# Patient Record
Sex: Female | Born: 1955
Health system: Southern US, Community
[De-identification: ages and names within clinical notes are randomized; demographics above are authoritative.]

## PROBLEM LIST (undated history)

## (undated) DIAGNOSIS — F329 Major depressive disorder, single episode, unspecified: Secondary | ICD-10-CM

## (undated) DIAGNOSIS — I872 Venous insufficiency (chronic) (peripheral): Secondary | ICD-10-CM

## (undated) DIAGNOSIS — F3181 Bipolar II disorder: Secondary | ICD-10-CM

## (undated) DIAGNOSIS — D126 Benign neoplasm of colon, unspecified: Secondary | ICD-10-CM

## (undated) DIAGNOSIS — G4733 Obstructive sleep apnea (adult) (pediatric): Secondary | ICD-10-CM

## (undated) DIAGNOSIS — M8580 Other specified disorders of bone density and structure, unspecified site: Secondary | ICD-10-CM

## (undated) DIAGNOSIS — Z808 Family history of malignant neoplasm of other organs or systems: Secondary | ICD-10-CM

## (undated) DIAGNOSIS — F32A Depression, unspecified: Secondary | ICD-10-CM

## (undated) DIAGNOSIS — Z780 Asymptomatic menopausal state: Secondary | ICD-10-CM

## (undated) DIAGNOSIS — E782 Mixed hyperlipidemia: Secondary | ICD-10-CM

## (undated) DIAGNOSIS — I83813 Varicose veins of bilateral lower extremities with pain: Secondary | ICD-10-CM

## (undated) DIAGNOSIS — Z803 Family history of malignant neoplasm of breast: Secondary | ICD-10-CM

## (undated) DIAGNOSIS — I471 Supraventricular tachycardia, unspecified: Secondary | ICD-10-CM

## (undated) DIAGNOSIS — C801 Malignant (primary) neoplasm, unspecified: Secondary | ICD-10-CM

## (undated) HISTORY — DX: Depression, unspecified: F32.A

## (undated) HISTORY — DX: Bipolar II disorder: F31.81

## (undated) HISTORY — PX: MELANOMA EXCISION: SHX5266

## (undated) HISTORY — DX: Family history of malignant neoplasm of other organs or systems: Z80.8

## (undated) HISTORY — DX: Major depressive disorder, single episode, unspecified: F32.9

## (undated) HISTORY — DX: Family history of malignant neoplasm of breast: Z80.3

---
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
## 1978-01-11 HISTORY — PX: DILATION AND CURETTAGE OF UTERUS: SHX78

## 1980-01-12 HISTORY — PX: BREAST EXCISIONAL BIOPSY: SUR124

## 1981-01-11 HISTORY — PX: BREAST CYST ASPIRATION: SHX578

## 2002-01-11 HISTORY — PX: BREAST CYST ASPIRATION: SHX578

## 2002-01-11 HISTORY — PX: BREAST SURGERY: SHX581

## 2004-06-02 ENCOUNTER — Ambulatory Visit: Payer: Self-pay | Admitting: Obstetrics and Gynecology

## 2004-08-24 LAB — HM COLONOSCOPY: HM Colonoscopy: NORMAL

## 2005-02-15 ENCOUNTER — Ambulatory Visit: Payer: Self-pay | Admitting: Psychiatry

## 2005-06-09 ENCOUNTER — Ambulatory Visit: Payer: Self-pay | Admitting: Obstetrics and Gynecology

## 2005-11-22 ENCOUNTER — Ambulatory Visit: Payer: Self-pay | Admitting: Gastroenterology

## 2005-11-22 HISTORY — PX: COLONOSCOPY: SHX174

## 2006-06-28 ENCOUNTER — Ambulatory Visit: Payer: Self-pay | Admitting: Obstetrics and Gynecology

## 2007-07-25 ENCOUNTER — Ambulatory Visit: Payer: Self-pay | Admitting: Obstetrics and Gynecology

## 2007-09-27 ENCOUNTER — Ambulatory Visit: Payer: Self-pay | Admitting: Obstetrics and Gynecology

## 2008-12-03 ENCOUNTER — Ambulatory Visit: Payer: Self-pay | Admitting: Internal Medicine

## 2008-12-16 ENCOUNTER — Other Ambulatory Visit: Payer: Self-pay | Admitting: Internal Medicine

## 2009-06-20 ENCOUNTER — Other Ambulatory Visit: Payer: Self-pay | Admitting: Surgical Oncology

## 2010-02-04 ENCOUNTER — Ambulatory Visit: Payer: Self-pay | Admitting: Internal Medicine

## 2010-03-25 LAB — HM PAP SMEAR: HM Pap smear: NORMAL

## 2011-04-14 ENCOUNTER — Ambulatory Visit: Payer: Self-pay | Admitting: General Practice

## 2012-08-11 LAB — LIPID PANEL: LDL Cholesterol: 125 mg/dL

## 2012-08-11 LAB — BASIC METABOLIC PANEL: Creatinine: 0.9 mg/dL (ref <=1.1)

## 2012-08-24 ENCOUNTER — Encounter: Payer: Self-pay | Admitting: Internal Medicine

## 2012-08-24 ENCOUNTER — Other Ambulatory Visit (HOSPITAL_COMMUNITY)
Admission: RE | Admit: 2012-08-24 | Discharge: 2012-08-24 | Disposition: A | Discharge status: Home / Self Care | Payer: 59 | Source: Ambulatory Visit | Attending: Internal Medicine | Admitting: Internal Medicine

## 2012-08-24 ENCOUNTER — Ambulatory Visit (INDEPENDENT_AMBULATORY_CARE_PROVIDER_SITE_OTHER): Payer: 59 | Admitting: Internal Medicine

## 2012-08-24 VITALS — BP 122/72 | HR 67 | Temp 98.2°F | Resp 12 | Ht 66.0 in | Wt 142.5 lb

## 2012-08-24 DIAGNOSIS — Z1151 Encounter for screening for human papillomavirus (HPV): Secondary | ICD-10-CM | POA: Insufficient documentation

## 2012-08-24 DIAGNOSIS — F319 Bipolar disorder, unspecified: Secondary | ICD-10-CM | POA: Insufficient documentation

## 2012-08-24 DIAGNOSIS — Z1239 Encounter for other screening for malignant neoplasm of breast: Secondary | ICD-10-CM

## 2012-08-24 DIAGNOSIS — Z124 Encounter for screening for malignant neoplasm of cervix: Secondary | ICD-10-CM

## 2012-08-24 DIAGNOSIS — Z01419 Encounter for gynecological examination (general) (routine) without abnormal findings: Secondary | ICD-10-CM | POA: Insufficient documentation

## 2012-08-24 DIAGNOSIS — Z0000 Encounter for general adult medical examination without abnormal findings: Secondary | ICD-10-CM | POA: Insufficient documentation

## 2012-08-24 DIAGNOSIS — F3181 Bipolar II disorder: Secondary | ICD-10-CM | POA: Insufficient documentation

## 2012-08-24 NOTE — Progress Notes (Signed)
Patient ID: Krystal Weaver, female   DOB: 19-Jun-1955, 57 y.o.   MRN: [id number]  Subjective:    Krystal Weaver is a 57 y.o. female who presents for an annual exam. The patient has no complaints today. The patient is sexually active. GYN screening history: last pap: was normal. The patient wears seatbelts: yes. The patient participates in regular exercise: yes. Has the patient ever been transfused or tattooed?: no. The patient reports that there is not domestic violence in her life.   Menstrual History: OB History   Grav Para Term Preterm Abortions TAB SAB Ect Mult Living                  Menarche age: 61  No LMP recorded. Patient is postmenopausal.    The following portions of the patient's history were reviewed and updated as appropriate: allergies, current medications, past family history, past medical history, past social history, past surgical history and problem list.  Review of Systems A comprehensive review of systems was negative except for: Musculoskeletal: positive for arthralgias    Objective:   BP 122/72  Pulse 67  Temp(Src) 98.2 F (36.8 C) (Oral)  Resp 12  Ht 5\' 6"  (1.676 m)  Wt 142 lb 8 oz (64.638 kg)  BMI 23.01 kg/m2  SpO2 98%  General Appearance:    Alert, cooperative, no distress, appears stated age  Head:    Normocephalic, without obvious abnormality, atraumatic  Eyes:    PERRL, conjunctiva/corneas clear, EOM's intact, fundi    benign, both eyes  Ears:    Normal TM's and external ear canals, both ears  Nose:   Nares normal, septum midline, mucosa normal, no drainage    or sinus tenderness  Throat:   Lips, mucosa, and tongue normal; teeth and gums normal  Neck:   Supple, symmetrical, trachea midline, no adenopathy;    thyroid:  no enlargement/tenderness/nodules; no carotid   bruit or JVD  Back:     Symmetric, no curvature, ROM normal, no CVA tenderness  Lungs:     Clear to auscultation bilaterally, respirations unlabored  Chest Wall:    No tenderness or  deformity   Heart:    Regular rate and rhythm, S1 and S2 normal, no murmur, rub   or gallop  Breast Exam:    No tenderness, masses, or nipple abnormality  Abdomen:     Soft, non-tender, bowel sounds active all four quadrants,    no masses, no organomegaly  Genitalia:    Pelvic: cervix normal in appearance, external genitalia normal, no adnexal masses or tenderness, no cervical motion tenderness, rectovaginal septum normal, uterus normal size, shape, and consistency and vagina normal without discharge  Extremities:   Extremities normal, atraumatic, no cyanosis or edema  Pulses:   2+ and symmetric all extremities  Skin:   Skin color, texture, turgor normal, no rashes or lesions  Lymph nodes:   Cervical, supraclavicular, and axillary nodes normal  Neurologic:   CNII-XII intact, normal strength, sensation and reflexes    throughout     Assessment:    Routine general medical examination at a health care facility Annual comprehensive exam was done including breast, pelvic and PAP smear. All screenings have been addressed .   She has a FH of breast CA and has dense breasts on exam,  So 3D mammogram advised.   Bipolar disorder, unspecified Type 2, managed for years by Emilia Beck with lamictal.    Updated Medication List Outpatient Encounter Prescriptions as of 08/24/2012  Medication Sig  Dispense Refill  . Calcium-Magnesium-Vitamin D 600-40-500 MG-MG-UNIT TB24 Take 1 tablet by mouth daily.      . Cholecalciferol (VITAMIN D3) 1000 UNITS CAPS Take 1 capsule by mouth 2 (two) times daily.      . fish oil-omega-3 fatty acids 1000 MG capsule Take 1,200 g by mouth daily.      Marland Kitchen lamoTRIgine (LAMICTAL) 100 MG tablet Take 50 mg by mouth daily.      . Multiple Vitamins-Minerals (MULTIVITAMIN WITH MINERALS) tablet Take 1 tablet by mouth daily.      . vitamin C (ASCORBIC ACID) 500 MG tablet Take 500 mg by mouth daily.       No facility-administered encounter medications on file as of 08/24/2012.

## 2012-08-24 NOTE — Assessment & Plan Note (Addendum)
Annual comprehensive exam was done including breast, pelvic and PAP smear. All screenings have been addressed .   She has a FH of breast CA and has dense breasts on exam,  So 3D mammogram advised.

## 2012-08-24 NOTE — Assessment & Plan Note (Signed)
Type 2, managed for years by Emilia Beck with lamictal.

## 2012-08-29 ENCOUNTER — Encounter: Payer: Self-pay | Admitting: *Deleted

## 2012-09-04 ENCOUNTER — Encounter: Payer: Self-pay | Admitting: General Practice

## 2012-10-17 ENCOUNTER — Telehealth: Payer: Self-pay | Admitting: Internal Medicine

## 2012-10-17 NOTE — Telephone Encounter (Signed)
Yes she can wait

## 2012-10-17 NOTE — Telephone Encounter (Signed)
Please advise 

## 2012-10-17 NOTE — Telephone Encounter (Signed)
Called patient to see if she had scheduled mammogram. She wants to wait until the 3D mammogram comes to [place]. She is wondering if it is ok to wait to the end of the year for this, or should she proceed with the regular one. Please advise.

## 2012-10-19 NOTE — Telephone Encounter (Signed)
Left message to call office

## 2012-10-20 NOTE — Telephone Encounter (Signed)
Left message, notifying pt she may wait for 3D mammogram

## 2013-04-09 ENCOUNTER — Ambulatory Visit: Payer: Self-pay | Admitting: Internal Medicine

## 2013-05-24 ENCOUNTER — Encounter: Payer: Self-pay | Admitting: Internal Medicine

## 2014-04-17 ENCOUNTER — Encounter: Payer: Self-pay | Admitting: *Deleted

## 2014-05-10 ENCOUNTER — Telehealth: Payer: Self-pay

## 2014-05-10 DIAGNOSIS — Z1239 Encounter for other screening for malignant neoplasm of breast: Secondary | ICD-10-CM

## 2014-05-10 NOTE — Telephone Encounter (Signed)
Mammogram ordered

## 2014-05-10 NOTE — Telephone Encounter (Signed)
The patient called and is hoping for an order for her mammogram to be ordered.

## 2014-05-10 NOTE — Telephone Encounter (Signed)
Please advise. In 2014 a 3 D mammogram was ordered.Marland Kitchen

## 2014-06-17 ENCOUNTER — Ambulatory Visit
Admission: RE | Admit: 2014-06-17 | Discharge: 2014-06-17 | Disposition: A | Discharge status: Home / Self Care | Payer: 59 | Source: Ambulatory Visit | Attending: Internal Medicine | Admitting: Internal Medicine

## 2014-06-17 ENCOUNTER — Other Ambulatory Visit: Payer: Self-pay | Admitting: Internal Medicine

## 2014-06-17 DIAGNOSIS — Z1239 Encounter for other screening for malignant neoplasm of breast: Secondary | ICD-10-CM

## 2014-06-17 DIAGNOSIS — Z1231 Encounter for screening mammogram for malignant neoplasm of breast: Secondary | ICD-10-CM | POA: Insufficient documentation

## 2014-06-19 ENCOUNTER — Encounter: Payer: Self-pay | Admitting: Internal Medicine

## 2014-08-02 NOTE — Telephone Encounter (Signed)
Mailed unread my chart message to patient

## 2014-10-24 ENCOUNTER — Other Ambulatory Visit
Admission: RE | Admit: 2014-10-24 | Discharge: 2014-10-24 | Disposition: A | Discharge status: Home / Self Care | Payer: 59 | Source: Ambulatory Visit | Attending: Psychiatry | Admitting: Psychiatry

## 2014-10-24 DIAGNOSIS — F3181 Bipolar II disorder: Secondary | ICD-10-CM | POA: Insufficient documentation

## 2014-10-24 LAB — TSH: TSH: 0.909 u[IU]/mL (ref 0.350–4.500)

## 2014-10-24 LAB — VITAMIN B12: VITAMIN B 12: 447 pg/mL (ref 180–914)

## 2014-10-24 LAB — FOLATE: FOLATE: 17.1 ng/mL (ref >5.9)

## 2014-10-25 LAB — VITAMIN D 25 HYDROXY (VIT D DEFICIENCY, FRACTURES): VIT D 25 HYDROXY: 34 ng/mL (ref 30.0–100.0)

## 2015-01-23 ENCOUNTER — Other Ambulatory Visit
Admission: RE | Admit: 2015-01-23 | Discharge: 2015-01-23 | Disposition: A | Discharge status: Home / Self Care | Payer: 59 | Source: Ambulatory Visit | Attending: Psychiatry | Admitting: Psychiatry

## 2015-01-23 DIAGNOSIS — F3181 Bipolar II disorder: Secondary | ICD-10-CM | POA: Insufficient documentation

## 2015-01-24 LAB — LAMOTRIGINE LEVEL: LAMOTRIGINE LVL: 1.4 ug/mL — AB (ref 2.0–20.0)

## 2015-02-12 DIAGNOSIS — D229 Melanocytic nevi, unspecified: Secondary | ICD-10-CM | POA: Diagnosis not present

## 2015-02-12 DIAGNOSIS — I8393 Asymptomatic varicose veins of bilateral lower extremities: Secondary | ICD-10-CM | POA: Diagnosis not present

## 2015-02-12 DIAGNOSIS — L578 Other skin changes due to chronic exposure to nonionizing radiation: Secondary | ICD-10-CM | POA: Diagnosis not present

## 2015-02-12 DIAGNOSIS — D1800 Hemangioma unspecified site: Secondary | ICD-10-CM | POA: Diagnosis not present

## 2015-02-12 DIAGNOSIS — L821 Other seborrheic keratosis: Secondary | ICD-10-CM | POA: Diagnosis not present

## 2015-02-12 DIAGNOSIS — Z1283 Encounter for screening for malignant neoplasm of skin: Secondary | ICD-10-CM | POA: Diagnosis not present

## 2015-02-19 ENCOUNTER — Encounter: Payer: Self-pay | Admitting: Internal Medicine

## 2015-02-19 ENCOUNTER — Ambulatory Visit (INDEPENDENT_AMBULATORY_CARE_PROVIDER_SITE_OTHER): Payer: 59 | Admitting: Internal Medicine

## 2015-02-19 VITALS — BP 104/70 | HR 74 | Temp 98.4°F | Resp 12 | Ht 65.0 in | Wt 147.5 lb

## 2015-02-19 DIAGNOSIS — E785 Hyperlipidemia, unspecified: Secondary | ICD-10-CM | POA: Diagnosis not present

## 2015-02-19 DIAGNOSIS — Z79899 Other long term (current) drug therapy: Secondary | ICD-10-CM | POA: Diagnosis not present

## 2015-02-19 DIAGNOSIS — Z1239 Encounter for other screening for malignant neoplasm of breast: Secondary | ICD-10-CM

## 2015-02-19 DIAGNOSIS — F319 Bipolar disorder, unspecified: Secondary | ICD-10-CM

## 2015-02-19 DIAGNOSIS — Z1211 Encounter for screening for malignant neoplasm of colon: Secondary | ICD-10-CM | POA: Diagnosis not present

## 2015-02-19 DIAGNOSIS — Z0000 Encounter for general adult medical examination without abnormal findings: Secondary | ICD-10-CM

## 2015-02-19 LAB — LIPID PANEL
CHOL/HDL RATIO: 3
Cholesterol: 250 mg/dL — ABNORMAL HIGH (ref 0–200)
HDL: 77.5 mg/dL (ref >39.00)
LDL CALC: 144 mg/dL — AB (ref 0–99)
NONHDL: 172.54
TRIGLYCERIDES: 142 mg/dL (ref 0.0–149.0)
VLDL: 28.4 mg/dL (ref 0.0–40.0)

## 2015-02-19 LAB — COMPREHENSIVE METABOLIC PANEL
ALT: 16 U/L (ref 0–35)
AST: 25 U/L (ref 0–37)
Albumin: 4.6 g/dL (ref 3.5–5.2)
Alkaline Phosphatase: 60 U/L (ref 39–117)
BILIRUBIN TOTAL: 0.4 mg/dL (ref 0.2–1.2)
BUN: 12 mg/dL (ref 6–23)
CALCIUM: 9.8 mg/dL (ref 8.4–10.5)
CHLORIDE: 102 meq/L (ref 96–112)
CO2: 33 meq/L — AB (ref 19–32)
Creatinine, Ser: 0.94 mg/dL (ref 0.40–1.20)
GFR: 64.51 mL/min (ref >60.00)
GLUCOSE: 90 mg/dL (ref 70–99)
Potassium: 4 mEq/L (ref 3.5–5.1)
Sodium: 141 mEq/L (ref 135–145)
Total Protein: 6.6 g/dL (ref 6.0–8.3)

## 2015-02-19 NOTE — Progress Notes (Signed)
Pre-visit discussion using our clinic review tool. No additional management support is needed unless otherwise documented below in the visit note.  

## 2015-02-19 NOTE — Progress Notes (Signed)
Patient ID: Krystal Weaver, female    DOB: 09-15-1955  Age: 61 y.o. MRN: [id number]  The patient is here for annual wellness examination and management of other chronic and acute problems.   Sister has BRCA at age 9,  Mammogram due June 2017 Due for PAP smear in August  Due for colonoscopy.  Krystal Weaver . Takes 2000 IUS Vit d3 daily Skin exam by Nehemiah Massed last week   neds cmet AND FASTING  lipids.   The risk factors are reflected in the social history.  The roster of all physicians providing medical care to patient - is listed in the Snapshot section of the chart.  Activities of daily living:  The patient is 100% independent in all ADLs: dressing, toileting, feeding as well as independent mobility  Home safety : The patient has smoke detectors in the home. They wear seatbelts.  There are no firearms at home. There is no violence in the home.   There is no risks for hepatitis, STDs or HIV. There is no   history of blood transfusion. They have no travel history to infectious disease endemic areas of the world.  The patient has seen their dentist in the last six month. They have seen their eye doctor in the last year. They admit to slight hearing difficulty with regard to whispered voices and some television programs.  They have deferred audiologic testing in the last year.  They do not  have excessive sun exposure. Discussed the need for sun protection: hats, long sleeves and use of sunscreen if there is significant sun exposure.   Diet: the importance of a healthy diet is discussed. They do have a healthy diet.  The benefits of regular aerobic exercise were discussed. She walks 4 times per week ,  20 minutes.   Depression screen: there are no signs or vegative symptoms of depression- irritability, change in appetite, anhedonia, sadness/tearfullness.  Cognitive assessment: the patient manages all their financial and personal affairs and is actively engaged. They could relate  day,date,year and events; recalled 2/3 objects at 3 minutes; performed clock-face test normally.  The following portions of the patient's history were reviewed and updated as appropriate: allergies, current medications, past family history, past medical history,  past surgical history, past social history  and problem list.  Visual acuity was not assessed per patient preference since she has regular follow up with her ophthalmologist. Hearing and body mass index were assessed and reviewed.   During the course of the visit the patient was educated and counseled about appropriate screening and preventive services including : fall prevention , diabetes screening, nutrition counseling, colorectal cancer screening, and recommended immunizations.    CC: The primary encounter diagnosis was Colon cancer screening. Diagnoses of Hyperlipidemia, Long-term use of high-risk medication, Breast cancer screening, Encounter for preventive health examination, and Bipolar disorder, unspecified ([place]) were also pertinent to this visit.  Dr Octavia Heir increased lamictal and started her on elavil in september for hypomania/ bipolar management with sleep disruption.  Constipation a minor issue since starting Elavil .  Eating steel cut oats has helped but  Using Sennakot and stool softener daily.  History Krystal Weaver has a past medical history of Depression.   She has past surgical history that includes Breast surgery (Left, 2004); Breast biopsy (Left, 1982); and Breast cyst aspiration (1983).   Her family history includes Alcohol abuse in her mother, sister, and son; Breast cancer (age of onset: 31) in her sister; Breast cancer (age of onset: 70) in  her maternal aunt; Cancer in her maternal aunt and sister; Cancer (age of onset: 59) in her father; Heart disease in her mother; Learning disabilities in her mother; Mental illness in her mother.She reports that she has never smoked. She has never used smokeless tobacco. She reports  that she drinks about 3.0 oz of alcohol per week. She reports that she does not use illicit drugs.  Outpatient Prescriptions Prior to Visit  Medication Sig Dispense Refill  . Cholecalciferol (VITAMIN D3) 1000 UNITS CAPS Take 1 capsule by mouth 2 (two) times daily.    . fish oil-omega-3 fatty acids 1000 MG capsule Take 1,200 g by mouth daily.    Marland Kitchen lamoTRIgine (LAMICTAL) 100 MG tablet Take 50 mg by mouth daily.    . Multiple Vitamins-Minerals (MULTIVITAMIN WITH MINERALS) tablet Take 1 tablet by mouth daily.    . vitamin C (ASCORBIC ACID) 500 MG tablet Take 500 mg by mouth daily.    . Calcium-Magnesium-Vitamin D 701-77-939 MG-MG-UNIT TB24 Take 1 tablet by mouth daily. Reported on 02/19/2015     No facility-administered medications prior to visit.    Review of Systems  Patient denies headache, fevers, malaise, unintentional weight loss, skin rash, eye pain, sinus congestion and sinus pain, sore throat, dysphagia,  hemoptysis , cough, dyspnea, wheezing, chest pain, palpitations, orthopnea, edema, abdominal pain, nausea, melena, diarrhea, constipation, flank pain, dysuria, hematuria, urinary  Frequency, nocturia, numbness, tingling, seizures,  Focal weakness, Loss of consciousness,  Tremor, insomnia, depression, anxiety, and suicidal ideation.    Objective:  BP 104/70 mmHg  Pulse 74  Temp(Src) 98.4 F (36.9 C) (Oral)  Resp 12  Ht _0  (1.651 m)  Wt 147 lb 8 oz (66.906 kg)  BMI 24.55 kg/m2  SpO2 99%  Physical Exam   General appearance: alert, cooperative and appears stated age Head: Normocephalic, without obvious abnormality, atraumatic Eyes: conjunctivae/corneas clear. PERRL, EOM's intact. Fundi benign. Ears: normal TM's and external ear canals both ears Nose: Nares normal. Septum midline. Mucosa normal. No drainage or sinus tenderness. Throat: lips, mucosa, and tongue normal; teeth and gums normal Neck: no adenopathy, no carotid bruit, no JVD, supple, symmetrical, trachea midline and  thyroid not enlarged, symmetric, no tenderness/mass/nodules Lungs: clear to auscultation bilaterally Breasts: normal appearance, no masses or tenderness Heart: regular rate and rhythm, S1, S2 normal, no murmur, click, rub or gallop Abdomen: soft, non-tender; bowel sounds normal; no masses,  no organomegaly Extremities: extremities normal, atraumatic, no cyanosis or edema Pulses: 2+ and symmetric Skin: Skin color, texture, turgor normal. No rashes or lesions Neurologic: Alert and oriented X 3, normal strength and tone. Normal symmetric reflexes. Normal coordination and gait.     Assessment & Plan:   Problem List Items Addressed This Visit    Bipolar disorder, unspecified ([place])    Managed with lamictal and elavil by Royston Bake.       Encounter for preventive health examination    Annual comprehensive preventive exam was done as well as an evaluation and assessment  of chronic conditions .  During the course of the visit the patient was educated and counseled about appropriate screening and preventive services including :  diabetes screening, lipid analysis with projected  10 year  risk for CAD , nutrition counseling, breast, cervical and colorectal cancer screening, and recommended immunizations.  Printed recommendations for health maintenance screenings was given      Hyperlipidemia    Based on current lipid profile, the risk of clinically significant Coronary artery disease is 3.9% over the  next 10 years, using the Framingham risk calculator.  No medication needed   Lab Results  Component Value Date   CHOL 250* 02/19/2015   HDL 77.50 02/19/2015   LDLCALC 144* 02/19/2015   TRIG 142.0 02/19/2015   CHOLHDL 3 02/19/2015         Relevant Orders   Lipid panel (Completed)    Other Visit Diagnoses    Colon cancer screening    -  Primary    Relevant Orders    Ambulatory referral to Gastroenterology    Long-term use of high-risk medication        Relevant Orders    Comprehensive  metabolic panel (Completed)    Breast cancer screening        Relevant Orders    MM DIGITAL SCREENING BILATERAL       I am having Krystal Weaver maintain her lamoTRIgine, fish oil-omega-3 fatty acids, Calcium-Magnesium-Vitamin D, vitamin C, Vitamin D3, multivitamin with minerals, amitriptyline, and Misc Natural Products (GLUCOS-CHONDROIT-MSM COMPLEX PO).  Meds ordered this encounter  Medications  . amitriptyline (ELAVIL) 50 MG tablet    Sig: Take 50 mg by mouth at bedtime.  . Misc Natural Products (GLUCOS-CHONDROIT-MSM COMPLEX PO)    Sig: Take 1 capsule by mouth 2 (two) times daily.    There are no discontinued medications.  Follow-up: No Follow-up on file.   Crecencio Mc, MD

## 2015-02-19 NOTE — Patient Instructions (Signed)
We will do your PAP smear next year!  Menopause is a normal process in which your reproductive ability comes to an end. This process happens gradually over a span of months to years, usually between the ages of 38 and 89. Menopause is complete when you have missed 12 consecutive menstrual periods. It is important to talk with your health care provider about some of the most common conditions that affect postmenopausal women, such as heart disease, cancer, and bone loss (osteoporosis). Adopting a healthy lifestyle and getting preventive care can help to promote your health and wellness. Those actions can also lower your chances of developing some of these common conditions. WHAT SHOULD I KNOW ABOUT MENOPAUSE? During menopause, you may experience a number of symptoms, such as:  Moderate-to-severe hot flashes.  Night sweats.  Decrease in sex drive.  Mood swings.  Headaches.  Tiredness.  Irritability.  Memory problems.  Insomnia. Choosing to treat or not to treat menopausal changes is an individual decision that you make with your health care provider. WHAT SHOULD I KNOW ABOUT HORMONE REPLACEMENT THERAPY AND SUPPLEMENTS? Hormone therapy products are effective for treating symptoms that are associated with menopause, such as hot flashes and night sweats. Hormone replacement carries certain risks, especially as you become older. If you are thinking about using estrogen or estrogen with progestin treatments, discuss the benefits and risks with your health care provider. WHAT SHOULD I KNOW ABOUT HEART DISEASE AND STROKE? Heart disease, heart attack, and stroke become more likely as you age. This may be due, in part, to the hormonal changes that your body experiences during menopause. These can affect how your body processes dietary fats, triglycerides, and cholesterol. Heart attack and stroke are both medical emergencies. There are many things that you can do to help prevent heart disease and  stroke:  Have your blood pressure checked at least every 1-2 years. High blood pressure causes heart disease and increases the risk of stroke.  If you are 32-100 years old, ask your health care provider if you should take aspirin to prevent a heart attack or a stroke.  Do not use any tobacco products, including cigarettes, chewing tobacco, or electronic cigarettes. If you need help quitting, ask your health care provider.  It is important to eat a healthy diet and maintain a healthy weight.  Be sure to include plenty of vegetables, fruits, low-fat dairy products, and lean protein.  Avoid eating foods that are high in solid fats, added sugars, or salt (sodium).  Get regular exercise. This is one of the most important things that you can do for your health.  Try to exercise for at least 150 minutes each week. The type of exercise that you do should increase your heart rate and make you sweat. This is known as moderate-intensity exercise.  Try to do strengthening exercises at least twice each week. Do these in addition to the moderate-intensity exercise.  Know your numbers.Ask your health care provider to check your cholesterol and your blood glucose. Continue to have your blood tested as directed by your health care provider. WHAT SHOULD I KNOW ABOUT CANCER SCREENING? There are several types of cancer. Take the following steps to reduce your risk and to catch any cancer development as early as possible. Breast Cancer  Practice breast self-awareness.  This means understanding how your breasts normally appear and feel.  It also means doing regular breast self-exams. Let your health care provider know about any changes, no matter how small.  If you  are 18 or older, have a clinician do a breast exam (clinical breast exam or CBE) every year. Depending on your age, family history, and medical history, it may be recommended that you also have a yearly breast X-ray (mammogram).  If you have a  family history of breast cancer, talk with your health care provider about genetic screening.  If you are at high risk for breast cancer, talk with your health care provider about having an MRI and a mammogram every year.  Breast cancer (BRCA) gene test is recommended for women who have family members with BRCA-related cancers. Results of the assessment will determine the need for genetic counseling and BRCA1 and for BRCA2 testing. BRCA-related cancers include these types:  Breast. This occurs in males or females.  Ovarian.  Tubal. This may also be called fallopian tube cancer.  Cancer of the abdominal or pelvic lining (peritoneal cancer).  Prostate.  Pancreatic. Cervical, Uterine, and Ovarian Cancer Your health care provider may recommend that you be screened regularly for cancer of the pelvic organs. These include your ovaries, uterus, and vagina. This screening involves a pelvic exam, which includes checking for microscopic changes to the surface of your cervix (Pap test).  For women ages 21-65, health care providers may recommend a pelvic exam and a Pap test every three years. For women ages 37-65, they may recommend the Pap test and pelvic exam, combined with testing for human papilloma virus (HPV), every five years. Some types of HPV increase your risk of cervical cancer. Testing for HPV may also be done on women of any age who have unclear Pap test results.  Other health care providers may not recommend any screening for nonpregnant women who are considered low risk for pelvic cancer and have no symptoms. Ask your health care provider if a screening pelvic exam is right for you.  If you have had past treatment for cervical cancer or a condition that could lead to cancer, you need Pap tests and screening for cancer for at least 20 years after your treatment. If Pap tests have been discontinued for you, your risk factors (such as having a new sexual partner) need to be reassessed to  determine if you should start having screenings again. Some women have medical problems that increase the chance of getting cervical cancer. In these cases, your health care provider may recommend that you have screening and Pap tests more often.  If you have a family history of uterine cancer or ovarian cancer, talk with your health care provider about genetic screening.  If you have vaginal bleeding after reaching menopause, tell your health care provider.  There are currently no reliable tests available to screen for ovarian cancer. Lung Cancer Lung cancer screening is recommended for adults 11-51 years old who are at high risk for lung cancer because of a history of smoking. A yearly low-dose CT scan of the lungs is recommended if you:  Currently smoke.  Have a history of at least 30 pack-years of smoking and you currently smoke or have quit within the past 15 years. A pack-year is smoking an average of one pack of cigarettes per day for one year. Yearly screening should:  Continue until it has been 15 years since you quit.  Stop if you develop a health problem that would prevent you from having lung cancer treatment. Colorectal Cancer  This type of cancer can be detected and can often be prevented.  Routine colorectal cancer screening usually begins at age 62  and continues through age 90.  If you have risk factors for colon cancer, your health care provider may recommend that you be screened at an earlier age.  If you have a family history of colorectal cancer, talk with your health care provider about genetic screening.  Your health care provider may also recommend using home test kits to check for hidden blood in your stool.  A small camera at the end of a tube can be used to examine your colon directly (sigmoidoscopy or colonoscopy). This is done to check for the earliest forms of colorectal cancer.  Direct examination of the colon should be repeated every 5-10 years until age  47. However, if early forms of precancerous polyps or small growths are found or if you have a family history or genetic risk for colorectal cancer, you may need to be screened more often. Skin Cancer  Check your skin from head to toe regularly.  Monitor any moles. Be sure to tell your health care provider:  About any new moles or changes in moles, especially if there is a change in a mole's shape or color.  If you have a mole that is larger than the size of a pencil eraser.  If any of your family members has a history of skin cancer, especially at a young age, talk with your health care provider about genetic screening.  Always use sunscreen. Apply sunscreen liberally and repeatedly throughout the day.  Whenever you are outside, protect yourself by wearing long sleeves, pants, a wide-brimmed hat, and sunglasses. WHAT SHOULD I KNOW ABOUT OSTEOPOROSIS? Osteoporosis is a condition in which bone destruction happens more quickly than new bone creation. After menopause, you may be at an increased risk for osteoporosis. To help prevent osteoporosis or the bone fractures that can happen because of osteoporosis, the following is recommended:  If you are 56-58 years old, get at least 1,000 mg of calcium and at least 600 mg of vitamin D per day.  If you are older than age 51 but younger than age 16, get at least 1,200 mg of calcium and at least 600 mg of vitamin D per day.  If you are older than age 30, get at least 1,200 mg of calcium and at least 800 mg of vitamin D per day. Smoking and excessive alcohol intake increase the risk of osteoporosis. Eat foods that are rich in calcium and vitamin D, and do weight-bearing exercises several times each week as directed by your health care provider. WHAT SHOULD I KNOW ABOUT HOW MENOPAUSE AFFECTS [place]? Depression may occur at any age, but it is more common as you become older. Common symptoms of depression include:  Low or sad mood.  Changes  in sleep patterns.  Changes in appetite or eating patterns.  Feeling an overall lack of motivation or enjoyment of activities that you previously enjoyed.  Frequent crying spells. Talk with your health care provider if you think that you are experiencing depression. WHAT SHOULD I KNOW ABOUT IMMUNIZATIONS? It is important that you get and maintain your immunizations. These include:  Tetanus, diphtheria, and pertussis (Tdap) booster vaccine.  Influenza every year before the flu season begins.  Pneumonia vaccine.  Shingles vaccine. Your health care provider may also recommend other immunizations.   This information is not intended to replace advice given to you by your health care provider. Make sure you discuss any questions you have with your health care provider.   Document Released: 02/19/2005 Document Revised: 01/18/2014  Document Reviewed: 08/30/2013 Elsevier Interactive Patient Education [employer].

## 2015-02-20 DIAGNOSIS — E785 Hyperlipidemia, unspecified: Secondary | ICD-10-CM | POA: Insufficient documentation

## 2015-02-20 NOTE — Assessment & Plan Note (Addendum)
Based on current lipid profile, the risk of clinically significant Coronary artery disease is 3.9% over the next 10 years, using the Framingham risk calculator.  No medication needed   Lab Results  Component Value Date   CHOL 250* 02/19/2015   HDL 77.50 02/19/2015   LDLCALC 144* 02/19/2015   TRIG 142.0 02/19/2015   CHOLHDL 3 02/19/2015

## 2015-02-20 NOTE — Assessment & Plan Note (Signed)
Managed with lamictal and elavil by Royston Bake.

## 2015-02-20 NOTE — Assessment & Plan Note (Signed)
Annual comprehensive preventive exam was done as well as an evaluation and assessment  of chronic conditions .  During the course of the visit the patient was educated and counseled about appropriate screening and preventive services including :  diabetes screening, lipid analysis with projected  10 year  risk for CAD , nutrition counseling, breast, cervical and colorectal cancer screening, and recommended immunizations.  Printed recommendations for health maintenance screenings was given

## 2015-02-22 ENCOUNTER — Encounter: Payer: Self-pay | Admitting: Internal Medicine

## 2015-03-24 DIAGNOSIS — F3181 Bipolar II disorder: Secondary | ICD-10-CM | POA: Diagnosis not present

## 2015-06-30 DIAGNOSIS — F3181 Bipolar II disorder: Secondary | ICD-10-CM | POA: Diagnosis not present

## 2015-07-02 DIAGNOSIS — H52222 Regular astigmatism, left eye: Secondary | ICD-10-CM | POA: Diagnosis not present

## 2015-07-02 DIAGNOSIS — H524 Presbyopia: Secondary | ICD-10-CM | POA: Diagnosis not present

## 2015-07-02 DIAGNOSIS — H5203 Hypermetropia, bilateral: Secondary | ICD-10-CM | POA: Diagnosis not present

## 2015-07-30 ENCOUNTER — Ambulatory Visit
Admission: RE | Admit: 2015-07-30 | Discharge: 2015-07-30 | Disposition: A | Discharge status: Home / Self Care | Payer: 59 | Source: Ambulatory Visit | Attending: Internal Medicine | Admitting: Internal Medicine

## 2015-07-30 ENCOUNTER — Other Ambulatory Visit: Payer: Self-pay | Admitting: Internal Medicine

## 2015-07-30 DIAGNOSIS — Z1239 Encounter for other screening for malignant neoplasm of breast: Secondary | ICD-10-CM

## 2015-07-30 DIAGNOSIS — Z1231 Encounter for screening mammogram for malignant neoplasm of breast: Secondary | ICD-10-CM | POA: Insufficient documentation

## 2015-10-06 DIAGNOSIS — F3181 Bipolar II disorder: Secondary | ICD-10-CM | POA: Diagnosis not present

## 2015-11-01 DIAGNOSIS — H578 Other specified disorders of eye and adnexa: Secondary | ICD-10-CM | POA: Diagnosis not present

## 2015-11-03 ENCOUNTER — Ambulatory Visit (INDEPENDENT_AMBULATORY_CARE_PROVIDER_SITE_OTHER): Payer: 59 | Admitting: Vascular Surgery

## 2015-11-03 ENCOUNTER — Encounter (INDEPENDENT_AMBULATORY_CARE_PROVIDER_SITE_OTHER): Payer: Self-pay | Admitting: Vascular Surgery

## 2015-11-03 VITALS — BP 132/61 | HR 65 | Resp 17 | Ht 66.0 in | Wt 152.0 lb

## 2015-11-03 DIAGNOSIS — M79605 Pain in left leg: Secondary | ICD-10-CM

## 2015-11-03 DIAGNOSIS — I83813 Varicose veins of bilateral lower extremities with pain: Secondary | ICD-10-CM | POA: Diagnosis not present

## 2015-11-03 DIAGNOSIS — E782 Mixed hyperlipidemia: Secondary | ICD-10-CM | POA: Diagnosis not present

## 2015-11-03 DIAGNOSIS — I872 Venous insufficiency (chronic) (peripheral): Secondary | ICD-10-CM | POA: Insufficient documentation

## 2015-11-03 DIAGNOSIS — M79604 Pain in right leg: Secondary | ICD-10-CM

## 2015-11-03 DIAGNOSIS — M79609 Pain in unspecified limb: Secondary | ICD-10-CM | POA: Insufficient documentation

## 2015-11-03 NOTE — Progress Notes (Signed)
[facility] Admission History & Physical  MRN : [id number]  Krystal Weaver is a 60 y.o. (24-Apr-1955) female who presents with chief complaint of  Chief Complaint  Patient presents with  . New Patient (Initial Visit)  .  History of Present Illness: The patient is seen for evaluation of symptomatic varicose veins. The patient relates burning and stinging which worsened steadily throughout the course of the day, particularly with standing. The patient also notes an aching and throbbing pain over the varicosities, particularly with prolonged dependent positions. The symptoms are significantly improved with elevation.  The patient also notes that during hot weather the symptoms are greatly intensified. The patient states the pain from the varicose veins interferes with work, daily exercise, shopping and household maintenance. At this point, the symptoms are persistent and severe enough that they're having a negative impact on lifestyle and are interfering with daily activities.  There is no history of DVT, PE or superficial thrombophlebitis. There is no history of ulceration or hemorrhage. The patient has a significant family history of varicose veins in her mother, son and sister. OB history:P3  The patient has not worn graduated compression in the past. At the present time the patient has not been using over-the-counter analgesics. There is no history of prior surgical intervention or sclerotherapy.    Current Meds  Medication Sig  . Cholecalciferol (VITAMIN D3) 1000 UNITS CAPS Take 1 capsule by mouth 2 (two) times daily.  Marland Kitchen erythromycin ophthalmic ointment Apply to eye.  . fish oil-omega-3 fatty acids 1000 MG capsule Take 1,200 g by mouth daily.  Marland Kitchen lamoTRIgine (LAMICTAL) 25 MG tablet Take by mouth.  . Multiple Vitamins-Minerals (MULTIVITAMIN WITH MINERALS) tablet Take 1 tablet by mouth daily.  Marland Kitchen omega-3 acid ethyl esters (LOVAZA) 1 g capsule Take by mouth.  .  predniSONE (DELTASONE) 10 MG tablet Take by mouth.  . vitamin C (ASCORBIC ACID) 500 MG tablet Take 500 mg by mouth daily.    Past Medical History:  Diagnosis Date  . Depression    Bi polar type 2    Past Surgical History:  Procedure Laterality Date  . BREAST BIOPSY Left 1982   Negative - Excisional  . BREAST CYST ASPIRATION  1983   Negative  . BREAST SURGERY Left 2004    Social History Social History  Substance Use Topics  . Smoking status: Never Smoker  . Smokeless tobacco: Never Used  . Alcohol use 3.0 oz/week    5 Glasses of wine per week    Family History Family History  Problem Relation Age of Onset  . Alcohol abuse Mother   . Heart disease Mother   . Learning disabilities Mother   . Mental illness Mother   . Cancer Father 28    brain tumor  . Alcohol abuse Sister   . Cancer Sister   . Alcohol abuse Son   . Breast cancer Sister 65  . Cancer Maternal Aunt   . Breast cancer Maternal Aunt 68    No Known Allergies   REVIEW OF SYSTEMS (Negative unless checked)  Constitutional: [] Weight loss  [] Fever  [] Chills Cardiac: [] Chest pain   [] Chest pressure   [] Palpitations   [] Shortness of breath when laying flat   [] Shortness of breath with exertion. Vascular:  [] Pain in legs with walking   [] Pain in legs at rest  [] History of DVT   [] Phlebitis   [x] Swelling in legs   [x] Varicose veins   [] Non-healing ulcers Pulmonary:   [] Uses  home oxygen   [] Productive cough   [] Hemoptysis   [] Wheeze  [] COPD   [] Asthma Neurologic:  [] Dizziness   [] Seizures   [] History of stroke   [] History of TIA  [] Aphasia   [] Vissual changes   [] Weakness or numbness in arm   [] Weakness or numbness in leg Musculoskeletal:   [] Joint swelling   [] Joint pain   [] Low back pain Hematologic:  [] Easy bruising  [] Easy bleeding   [] Hypercoagulable state   [] Anemic Gastrointestinal:  [] Diarrhea   [] Vomiting  [] Gastroesophageal reflux/heartburn   [] Difficulty swallowing. Genitourinary:  [] Chronic kidney  disease   [] Difficult urination  [] Frequent urination   [] Blood in urine Skin:  [] Rashes   [] Ulcers  Psychological:  [] History of anxiety   []  History of major depression.  Physical Examination  Vitals:   11/03/15 1458  BP: 132/61  Pulse: 65  Resp: 17  Weight: 152 lb (68.9 kg)  Height: 5\' 6"  (1.676 m)   Body mass index is 24.53 kg/m. Gen: WD/WN, NAD Head: [place]/AT, No temporalis wasting.  Ear/Nose/Throat: Hearing grossly intact, nares w/o erythema or drainage, poor dentition Eyes: PER, EOMI, sclera nonicteric.  Neck: Supple, no masses.  No bruit or JVD.  Pulmonary:  Good air movement, clear to auscultation bilaterally, no use of accessory muscles.  Cardiac: RRR, normal S1, S2, no Murmurs. Vascular:  Large varicosities bilateral lower extremities, mild venous changes both ankles Vessel Right Left  Radial Palpable Palpable  Ulnar Palpable Palpable  Brachial Palpable Palpable  Carotid Palpable Palpable  Femoral Palpable Palpable  Popliteal Palpable Palpable  PT Palpable Palpable  DP Palpable Palpable   Gastrointestinal: soft, non-distended. No guarding/no peritoneal signs.  Musculoskeletal: M/S 5/5 throughout.  No deformity or atrophy.  Neurologic: CN 2-12 intact. Pain and light touch intact in extremities.  Symmetrical.  Speech is fluent. Motor exam as listed above. Psychiatric: Judgment intact, Mood & affect appropriate for pt's clinical situation. Dermatologic: No rashes or ulcers noted.  No changes consistent with cellulitis. Lymph : No Cervical lymphadenopathy, no lichenification or skin changes of chronic lymphedema.  CBC No results found for: WBC, HGB, HCT, MCV, PLT  BMET    Component Value Date/Time   NA 141 02/19/2015 1512   K 4.0 02/19/2015 1512   CL 102 02/19/2015 1512   CO2 33 (H) 02/19/2015 1512   GLUCOSE 90 02/19/2015 1512   BUN 12 02/19/2015 1512   BUN 13 08/11/2012   CREATININE 0.94 02/19/2015 1512   CALCIUM 9.8 02/19/2015 1512   CrCl cannot be  calculated (Patient's most recent lab result is older than the maximum 21 days allowed.).  COAG No results found for: INR, PROTIME  Radiology No results found.  Assessment/Plan 1. Varicose veins of both lower extremities with pain  Recommend:  The patient has large symptomatic varicose veins that are painful and associated with swelling.  I have had a long discussion with the patient regarding  varicose veins and why they cause symptoms.  Patient will begin wearing graduated compression stockings class 1 on a daily basis, beginning first thing in the morning and removing them in the evening. The patient is instructed specifically not to sleep in the stockings.    The patient  will also begin using over-the-counter analgesics such as Motrin 600 mg po TID to help control the symptoms.    In addition, behavioral modification including elevation during the day will be initiated.    Pending the results of these changes the  patient will be reevaluated in three months.  An  ultrasound of the venous system will be obtained.   Further plans will be based on the ultrasound results and whether conservative therapies are successful at eliminating the pain and swelling.  - VAS [place] LOWER EXTREMITY VENOUS REFLUX; Future  2. Chronic venous insufficiency See # 1  3. Pain in both lower extremities See # 1  4. Mixed hyperlipidemia Continue statin as ordered and reviewed, no changes at this time    Hortencia Pilar, MD  11/03/2015 5:41 PM

## 2016-01-01 DIAGNOSIS — Z1211 Encounter for screening for malignant neoplasm of colon: Secondary | ICD-10-CM | POA: Diagnosis not present

## 2016-01-01 DIAGNOSIS — Z8371 Family history of colonic polyps: Secondary | ICD-10-CM | POA: Diagnosis not present

## 2016-01-07 ENCOUNTER — Encounter: Payer: Self-pay | Admitting: Physician Assistant

## 2016-01-07 ENCOUNTER — Ambulatory Visit: Payer: Self-pay | Admitting: Physician Assistant

## 2016-01-07 VITALS — BP 112/62 | HR 82 | Temp 98.7°F

## 2016-01-07 DIAGNOSIS — H1089 Other conjunctivitis: Secondary | ICD-10-CM

## 2016-01-07 MED ORDER — PREDNISOLONE SODIUM PHOSPHATE 1 % OP SOLN: 1.0000 [drp] | OPHTHALMIC | 0 refills | Status: DC

## 2016-01-07 NOTE — Progress Notes (Signed)
S: states her eyes is irritated, wears one contact in the r eye, was out by the fire pit and thinks it dried her eye out, used e-mycin opth ointment as lubricant, is still a little red but not painful, no matting or drainage, similar sx in past resolved with steroid drop  O: vitals wnl, nad, r eye injected, no matting or drainage, conjunctiva of lids wnl, neck supple no lymph, lungs c t a, cv rrr  A: conjunctivitis  P: finish e-mycin, use prednisolone opth gtts

## 2016-02-05 ENCOUNTER — Ambulatory Visit (INDEPENDENT_AMBULATORY_CARE_PROVIDER_SITE_OTHER): Payer: 59 | Admitting: Vascular Surgery

## 2016-02-05 ENCOUNTER — Encounter (INDEPENDENT_AMBULATORY_CARE_PROVIDER_SITE_OTHER): Payer: 59

## 2016-03-03 DIAGNOSIS — K635 Polyp of colon: Secondary | ICD-10-CM | POA: Diagnosis not present

## 2016-03-03 DIAGNOSIS — D128 Benign neoplasm of rectum: Secondary | ICD-10-CM | POA: Diagnosis not present

## 2016-03-03 DIAGNOSIS — D129 Benign neoplasm of anus and anal canal: Secondary | ICD-10-CM | POA: Diagnosis not present

## 2016-03-03 DIAGNOSIS — D127 Benign neoplasm of rectosigmoid junction: Secondary | ICD-10-CM | POA: Diagnosis not present

## 2016-03-03 DIAGNOSIS — D126 Benign neoplasm of colon, unspecified: Secondary | ICD-10-CM | POA: Diagnosis not present

## 2016-03-03 DIAGNOSIS — D438 Neoplasm of uncertain behavior of other specified parts of central nervous system: Secondary | ICD-10-CM | POA: Diagnosis not present

## 2016-03-03 DIAGNOSIS — Z1211 Encounter for screening for malignant neoplasm of colon: Secondary | ICD-10-CM | POA: Diagnosis not present

## 2016-03-03 DIAGNOSIS — D120 Benign neoplasm of cecum: Secondary | ICD-10-CM | POA: Diagnosis not present

## 2016-03-03 DIAGNOSIS — Z8371 Family history of colonic polyps: Secondary | ICD-10-CM | POA: Diagnosis not present

## 2016-03-03 HISTORY — PX: COLONOSCOPY: SHX174

## 2016-03-03 LAB — HM COLONOSCOPY

## 2016-03-10 ENCOUNTER — Ambulatory Visit (INDEPENDENT_AMBULATORY_CARE_PROVIDER_SITE_OTHER): Payer: 59

## 2016-03-10 ENCOUNTER — Ambulatory Visit (INDEPENDENT_AMBULATORY_CARE_PROVIDER_SITE_OTHER): Payer: 59 | Admitting: Vascular Surgery

## 2016-03-10 DIAGNOSIS — I83813 Varicose veins of bilateral lower extremities with pain: Secondary | ICD-10-CM | POA: Diagnosis not present

## 2016-03-22 ENCOUNTER — Ambulatory Visit (INDEPENDENT_AMBULATORY_CARE_PROVIDER_SITE_OTHER): Payer: 59 | Admitting: Vascular Surgery

## 2016-03-22 ENCOUNTER — Encounter (INDEPENDENT_AMBULATORY_CARE_PROVIDER_SITE_OTHER): Payer: Self-pay | Admitting: Vascular Surgery

## 2016-03-22 VITALS — BP 123/66 | HR 57 | Resp 16 | Wt 147.0 lb

## 2016-03-22 DIAGNOSIS — I872 Venous insufficiency (chronic) (peripheral): Secondary | ICD-10-CM | POA: Diagnosis not present

## 2016-03-22 DIAGNOSIS — M79605 Pain in left leg: Secondary | ICD-10-CM | POA: Diagnosis not present

## 2016-03-22 DIAGNOSIS — E782 Mixed hyperlipidemia: Secondary | ICD-10-CM | POA: Diagnosis not present

## 2016-03-22 DIAGNOSIS — M79604 Pain in right leg: Secondary | ICD-10-CM | POA: Diagnosis not present

## 2016-03-22 DIAGNOSIS — I83813 Varicose veins of bilateral lower extremities with pain: Secondary | ICD-10-CM | POA: Diagnosis not present

## 2016-03-22 NOTE — Progress Notes (Signed)
MRN : [id number]  Krystal Weaver is a 61 y.o. (1955/10/05) female who presents with chief complaint of  Chief Complaint  Patient presents with  . Follow-up  .  History of Present Illness: The patient returns for followup evaluation 3 months after the initial visit. The patient continues to have pain in the lower extremities with dependency. The pain is lessened with elevation. Graduated compression stockings, Class I (20-30 mmHg), have been worn but the stockings do not eliminate the leg pain. Over-the-counter analgesics do not improve the symptoms. The degree of discomfort continues to interfere with daily activities. The patient notes the pain in the legs is causing problems with daily exercise, at the workplace and even with household activities and maintenance such as standing in the kitchen preparing meals and doing dishes.   Venous ultrasound shows reflux bilateral deep venous system, no evidence of acute or chronic DVT.  Superficial reflux is present in the bilateral great saphenous vein and the left small saphenous vein  Current Meds  Medication Sig  . Cholecalciferol (VITAMIN D3) 1000 UNITS CAPS Take 1 capsule by mouth 2 (two) times daily.  . fish oil-omega-3 fatty acids 1000 MG capsule Take 1,200 g by mouth daily.  Marland Kitchen lamoTRIgine (LAMICTAL) 25 MG tablet Take by mouth.  . Misc Natural Products (GLUCOS-CHONDROIT-MSM COMPLEX PO) Take 1 capsule by mouth 2 (two) times daily.  . Multiple Vitamins-Minerals (MULTIVITAMIN WITH MINERALS) tablet Take 1 tablet by mouth daily.  . vitamin C (ASCORBIC ACID) 500 MG tablet Take 500 mg by mouth daily.    Past Medical History:  Diagnosis Date  . Depression    Bi polar type 2    Past Surgical History:  Procedure Laterality Date  . BREAST BIOPSY Left 1982   Negative - Excisional  . BREAST CYST ASPIRATION  1983   Negative  . BREAST SURGERY Left 2004    Social History Social History  Substance Use Topics  . Smoking status: Never  Smoker  . Smokeless tobacco: Never Used  . Alcohol use 3.0 oz/week    5 Glasses of wine per week    Family History Family History  Problem Relation Age of Onset  . Alcohol abuse Mother   . Heart disease Mother   . Learning disabilities Mother   . Mental illness Mother   . Cancer Father 66    brain tumor  . Alcohol abuse Sister   . Cancer Sister   . Alcohol abuse Son   . Breast cancer Sister 87  . Cancer Maternal Aunt   . Breast cancer Maternal Aunt 68  No family history of bleeding/clotting disorders, porphyria or autoimmune disease  No Known Allergies   REVIEW OF SYSTEMS (Negative unless checked)  Constitutional: [] Weight loss  [] Fever  [] Chills Cardiac: [] Chest pain   [] Chest pressure   [] Palpitations   [] Shortness of breath when laying flat   [] Shortness of breath with exertion. Vascular:  [] Pain in legs with walking   [] Pain in legs at rest  [] History of DVT   [] Phlebitis   [x] Swelling in legs   [x] Varicose veins   [] Non-healing ulcers Pulmonary:   [] Uses home oxygen   [] Productive cough   [] Hemoptysis   [] Wheeze  [] COPD   [] Asthma Neurologic:  [] Dizziness   [] Seizures   [] History of stroke   [] History of TIA  [] Aphasia   [] Vissual changes   [] Weakness or numbness in arm   [] Weakness or numbness in leg Musculoskeletal:   [] Joint swelling   [] Joint  pain   [] Low back pain Hematologic:  [] Easy bruising  [] Easy bleeding   [] Hypercoagulable state   [] Anemic Gastrointestinal:  [] Diarrhea   [] Vomiting  [] Gastroesophageal reflux/heartburn   [] Difficulty swallowing. Genitourinary:  [] Chronic kidney disease   [] Difficult urination  [] Frequent urination   [] Blood in urine Skin:  [] Rashes   [] Ulcers  Psychological:  [] History of anxiety   []  History of major depression.  Physical Examination  Vitals:   03/22/16 0911  BP: 123/66  Pulse: (!) 57  Resp: 16  Weight: 147 lb (66.7 kg)   Body mass index is 23.73 kg/m. Gen: WD/WN, NAD Head: [place]/AT, No temporalis wasting.    Ear/Nose/Throat: Hearing grossly intact, nares w/o erythema or drainage, poor dentition Eyes: PER, EOMI, sclera nonicteric.  Neck: Supple, no masses.  No bruit or JVD.  Pulmonary:  Good air movement, clear to auscultation bilaterally, no use of accessory muscles.  Cardiac: RRR, normal S1, S2, no Murmurs. Vascular: Large varicosities present extensively; greater than 10 mm bilaterally.  Mild venous stasis changes to the legs bilaterally.  1+ soft pitting edema Vessel Right Left  Radial Palpable Palpable  Ulnar Palpable Palpable  Brachial Palpable Palpable  Carotid Palpable Palpable  Femoral Palpable Palpable  Popliteal Palpable Palpable  PT Palpable Palpable  DP Palpable Palpable   Gastrointestinal: soft, non-distended. No guarding/no peritoneal signs.  Musculoskeletal: M/S 5/5 throughout.  No deformity or atrophy.  Neurologic: CN 2-12 intact. Pain and light touch intact in extremities.  Symmetrical.  Speech is fluent. Motor exam as listed above. Psychiatric: Judgment intact, Mood & affect appropriate for pt's clinical situation. Dermatologic: No rashes or ulcers noted.  No changes consistent with cellulitis. Lymph : No Cervical lymphadenopathy, no lichenification or skin changes of chronic lymphedema.  CBC No results found for: WBC, HGB, HCT, MCV, PLT  BMET    Component Value Date/Time   NA 141 02/19/2015 1512   K 4.0 02/19/2015 1512   CL 102 02/19/2015 1512   CO2 33 (H) 02/19/2015 1512   GLUCOSE 90 02/19/2015 1512   BUN 12 02/19/2015 1512   BUN 13 08/11/2012   CREATININE 0.94 02/19/2015 1512   CALCIUM 9.8 02/19/2015 1512   CrCl cannot be calculated (Patient's most recent lab result is older than the maximum 21 days allowed.).  COAG No results found for: INR, PROTIME  Radiology No results found.  Assessment/Plan 1. Varicose veins of both lower extremities with pain Recommend  I have reviewed my previous  discussion with the patient regarding  varicose veins and  why they cause symptoms. Patient will continue  wearing graduated compression stockings class 1 on a daily basis, beginning first thing in the morning and removing them in the evening.    In addition, behavioral modification including elevation during the day was again discussed and this will continue.  The patient has utilized over the counter pain medications and has been exercising.  However, at this time conservative therapy has not alleviated the patient's symptoms of leg pain and swelling  Recommend: laser ablation of the right and  left great saphenous and the left small saphenous veins to eliminate the symptoms of pain and swelling of the lower extremities caused by the severe superficial venous reflux disease.   2. Pain in both lower extremities See #1  3. Chronic venous insufficiency See #1  4. Mixed hyperlipidemia Continue statin as ordered and reviewed, no changes at this time   Hortencia Pilar, MD  03/22/2016 5:38 PM

## 2016-04-05 DIAGNOSIS — F3181 Bipolar II disorder: Secondary | ICD-10-CM | POA: Diagnosis not present

## 2016-07-15 ENCOUNTER — Encounter (INDEPENDENT_AMBULATORY_CARE_PROVIDER_SITE_OTHER): Payer: Self-pay | Admitting: Vascular Surgery

## 2016-07-15 ENCOUNTER — Ambulatory Visit (INDEPENDENT_AMBULATORY_CARE_PROVIDER_SITE_OTHER): Payer: 59 | Admitting: Vascular Surgery

## 2016-07-15 VITALS — BP 123/70 | HR 64 | Resp 14 | Ht 65.5 in | Wt 143.0 lb

## 2016-07-15 DIAGNOSIS — I83813 Varicose veins of bilateral lower extremities with pain: Secondary | ICD-10-CM | POA: Diagnosis not present

## 2016-07-15 DIAGNOSIS — I872 Venous insufficiency (chronic) (peripheral): Secondary | ICD-10-CM

## 2016-07-15 NOTE — Progress Notes (Signed)
    MRN : [id number]  Krystal Weaver is a 61 y.o. (July 25, 1955) female who presents with chief complaint of  Chief Complaint  Patient presents with  . Venous Insufficiency    Left GSV laser ablation  .    The patient's left lower extremity was sterilely prepped and draped.  The ultrasound machine was used to visualize the left great saphenous vein throughout its course.  A segment of the left great saphenous vein below the knee was selected for access.  The saphenous vein was accessed without difficulty using ultrasound guidance with a micropuncture needle.   An 0.018  wire was placed beyond the saphenofemoral junction through the sheath and the microneedle was removed.  The 65 cm sheath was then placed over the wire and the wire and dilator were removed.  The laser fiber was placed through the sheath and its tip was placed approximately 2 cm below the saphenofemoral junction.  Tumescent anesthesia was then created with a dilute lidocaine solution.  Laser energy was then delivered with constant withdrawal of the sheath and laser fiber.  Approximately 1496 Joules of energy were delivered over a length of 42 cm.  Sterile dressings were placed.  The patient tolerated the procedure well without complications.

## 2016-07-19 ENCOUNTER — Ambulatory Visit (INDEPENDENT_AMBULATORY_CARE_PROVIDER_SITE_OTHER): Payer: 59

## 2016-07-19 DIAGNOSIS — I83813 Varicose veins of bilateral lower extremities with pain: Secondary | ICD-10-CM | POA: Diagnosis not present

## 2016-07-21 DIAGNOSIS — H524 Presbyopia: Secondary | ICD-10-CM | POA: Diagnosis not present

## 2016-08-18 ENCOUNTER — Ambulatory Visit (INDEPENDENT_AMBULATORY_CARE_PROVIDER_SITE_OTHER): Payer: 59 | Admitting: Internal Medicine

## 2016-08-18 ENCOUNTER — Encounter: Payer: Self-pay | Admitting: Internal Medicine

## 2016-08-18 ENCOUNTER — Other Ambulatory Visit (HOSPITAL_COMMUNITY)
Admission: RE | Admit: 2016-08-18 | Discharge: 2016-08-18 | Disposition: A | Discharge status: Home / Self Care | Payer: 59 | Source: Ambulatory Visit | Attending: Internal Medicine | Admitting: Internal Medicine

## 2016-08-18 VITALS — BP 114/64 | HR 57 | Temp 98.2°F | Resp 14 | Ht 65.5 in | Wt 140.6 lb

## 2016-08-18 DIAGNOSIS — Z124 Encounter for screening for malignant neoplasm of cervix: Secondary | ICD-10-CM | POA: Insufficient documentation

## 2016-08-18 DIAGNOSIS — R5383 Other fatigue: Secondary | ICD-10-CM | POA: Diagnosis not present

## 2016-08-18 DIAGNOSIS — F3170 Bipolar disorder, currently in remission, most recent episode unspecified: Secondary | ICD-10-CM | POA: Diagnosis not present

## 2016-08-18 DIAGNOSIS — Z1239 Encounter for other screening for malignant neoplasm of breast: Secondary | ICD-10-CM

## 2016-08-18 DIAGNOSIS — I83813 Varicose veins of bilateral lower extremities with pain: Secondary | ICD-10-CM | POA: Diagnosis not present

## 2016-08-18 DIAGNOSIS — Z1231 Encounter for screening mammogram for malignant neoplasm of breast: Secondary | ICD-10-CM | POA: Diagnosis not present

## 2016-08-18 DIAGNOSIS — E782 Mixed hyperlipidemia: Secondary | ICD-10-CM

## 2016-08-18 DIAGNOSIS — Z0000 Encounter for general adult medical examination without abnormal findings: Secondary | ICD-10-CM | POA: Diagnosis not present

## 2016-08-18 DIAGNOSIS — Z811 Family history of alcohol abuse and dependence: Secondary | ICD-10-CM

## 2016-08-18 DIAGNOSIS — E559 Vitamin D deficiency, unspecified: Secondary | ICD-10-CM | POA: Diagnosis not present

## 2016-08-18 LAB — COMPREHENSIVE METABOLIC PANEL
ALT: 14 U/L (ref 0–35)
AST: 17 U/L (ref 0–37)
Albumin: 4.9 g/dL (ref 3.5–5.2)
Alkaline Phosphatase: 62 U/L (ref 39–117)
BILIRUBIN TOTAL: 0.7 mg/dL (ref 0.2–1.2)
BUN: 13 mg/dL (ref 6–23)
CALCIUM: 10.3 mg/dL (ref 8.4–10.5)
CHLORIDE: 99 meq/L (ref 96–112)
CO2: 33 meq/L — AB (ref 19–32)
Creatinine, Ser: 0.91 mg/dL (ref 0.40–1.20)
GFR: 66.64 mL/min (ref >60.00)
Glucose, Bld: 98 mg/dL (ref 70–99)
Potassium: 4.1 mEq/L (ref 3.5–5.1)
Sodium: 135 mEq/L (ref 135–145)
Total Protein: 7.2 g/dL (ref 6.0–8.3)

## 2016-08-18 LAB — LIPID PANEL
CHOL/HDL RATIO: 3
Cholesterol: 235 mg/dL — ABNORMAL HIGH (ref 0–200)
HDL: 78.7 mg/dL (ref >39.00)
LDL CALC: 141 mg/dL — AB (ref 0–99)
NonHDL: 156.17
TRIGLYCERIDES: 76 mg/dL (ref 0.0–149.0)
VLDL: 15.2 mg/dL (ref 0.0–40.0)

## 2016-08-18 LAB — CBC WITH DIFFERENTIAL/PLATELET
BASOS ABS: 0.1 10*3/uL (ref 0.0–0.1)
BASOS PCT: 0.9 % (ref 0.0–3.0)
EOS ABS: 0.1 10*3/uL (ref 0.0–0.7)
Eosinophils Relative: 1.4 % (ref 0.0–5.0)
HEMATOCRIT: 44.1 % (ref 36.0–46.0)
Hemoglobin: 14.5 g/dL (ref 12.0–15.0)
LYMPHS ABS: 2 10*3/uL (ref 0.7–4.0)
LYMPHS PCT: 32 % (ref 12.0–46.0)
MCHC: 32.8 g/dL (ref 30.0–36.0)
MCV: 90.9 fl (ref 78.0–100.0)
MONO ABS: 0.4 10*3/uL (ref 0.1–1.0)
Monocytes Relative: 6.3 % (ref 3.0–12.0)
NEUTROS ABS: 3.7 10*3/uL (ref 1.4–7.7)
NEUTROS PCT: 59.4 % (ref 43.0–77.0)
PLATELETS: 314 10*3/uL (ref 150.0–400.0)
RBC: 4.85 Mil/uL (ref 3.87–5.11)
RDW: 13.1 % (ref 11.5–15.5)
WBC: 6.2 10*3/uL (ref 4.0–10.5)

## 2016-08-18 LAB — TSH: TSH: 0.64 u[IU]/mL (ref 0.35–4.50)

## 2016-08-18 LAB — VITAMIN D 25 HYDROXY (VIT D DEFICIENCY, FRACTURES): VITD: 47.08 ng/mL (ref 30.00–100.00)

## 2016-08-18 NOTE — Progress Notes (Signed)
Patient ID: Krystal Weaver, female    DOB: 05/05/1955  Age: 62 y.o. MRN: [id number]  The patient is here for annual preventive examination and management of other chronic and acute problems.   Colon polyps March 2018 [facility].  5 yr follow up due  Mammogram ordered PAP normal 2014   Last lipids Feb 2017 LDL 144 HDL 78  Had VV surgery on  Left leg,  Right leg on Aug 20th wearing compression stockings   The risk factors are reflected in the social history.  The roster of all physicians providing medical care to patient - is listed in the Snapshot section of the chart.  Activities of daily living:  The patient is 100% independent in all ADLs: dressing, toileting, feeding as well as independent mobility  Home safety : The patient has smoke detectors in the home. They wear seatbelts.  There are no firearms at home. There is no violence in the home.   There is no risks for hepatitis, STDs or HIV. There is no   history of blood transfusion. They have no travel history to infectious disease endemic areas of the world.  The patient has seen their dentist in the last six month. They have seen their eye doctor in the last year. They do not  have excessive sun exposure. Discussed the need for sun protection: hats, long sleeves and use of sunscreen if there is significant sun exposure.   Diet: the importance of a healthy diet is discussed. They do have a healthy diet.  The benefits of regular aerobic exercise were discussed. She walks 4 times per week ,  20 minutes.   Depression screen: there are no signs or vegative symptoms of untreated depression- irritability, change in appetite, anhedonia, sadness/tearfullness.   The following portions of the patient's history were reviewed and updated as appropriate: allergies, current medications, past family history, past medical history,  past surgical history, past social history  and problem list.  Visual acuity was not assessed per patient preference  since she has regular follow up with her ophthalmologist. Hearing and body mass index were assessed and reviewed.   During the course of the visit the patient was educated and counseled about appropriate screening and preventive services including : fall prevention , diabetes screening, nutrition counseling, colorectal cancer screening, and recommended immunizations.    CC: The primary encounter diagnosis was Encounter for screening for cervical cancer . Diagnoses of Screening breast examination, Mixed hyperlipidemia, Fatigue, unspecified type, Vitamin D deficiency, Bipolar disorder in full remission, most recent episode unspecified type ([place]), Family history of alcoholism, Varicose veins of both lower extremities with pain, and Encounter for preventive health examination were also pertinent to this visit.  History Krystal Weaver has a past medical history of Depression.   She has a past surgical history that includes Breast surgery (Left, 2004); Breast cyst aspiration (1983); and Breast biopsy (Left, 1982).   Her family history includes Alcohol abuse in her mother, sister, and son; Breast cancer (age of onset: 23) in her sister; Breast cancer (age of onset: 59) in her maternal aunt; Cancer in her maternal aunt and sister; Cancer (age of onset: 47) in her father; Heart disease in her mother; Learning disabilities in her mother; Mental illness in her mother.She reports that she has never smoked. She has never used smokeless tobacco. She reports that she drinks about 3.0 oz of alcohol per week . She reports that she does not use drugs.  Outpatient Medications Prior to Visit  Medication  Sig Dispense Refill  . Cholecalciferol (VITAMIN D3) 1000 UNITS CAPS Take 1 capsule by mouth 2 (two) times daily.    . fish oil-omega-3 fatty acids 1000 MG capsule Take 1,200 g by mouth daily.    . Misc Natural Products (GLUCOS-CHONDROIT-MSM COMPLEX PO) Take 1 capsule by mouth 2 (two) times daily.    . Multiple  Vitamins-Minerals (MULTIVITAMIN WITH MINERALS) tablet Take 1 tablet by mouth daily.    . vitamin C (ASCORBIC ACID) 500 MG tablet Take 500 mg by mouth daily.    Marland Kitchen ALPRAZolam (XANAX) 0.5 MG tablet   0  . amitriptyline (ELAVIL) 50 MG tablet Take 50 mg by mouth at bedtime.    . Calcium-Magnesium-Vitamin D 675-91-638 MG-MG-UNIT TB24 Take 1 tablet by mouth daily. Reported on 02/19/2015    . lamoTRIgine (LAMICTAL) 25 MG tablet Take by mouth.    . omega-3 acid ethyl esters (LOVAZA) 1 g capsule Take by mouth.    . polyethylene glycol-electrolytes (NULYTELY/GOLYTELY) 420 g solution Take by mouth.     No facility-administered medications prior to visit.     Review of Systems   Patient denies headache, fevers, malaise, unintentional weight loss, skin rash, eye pain, sinus congestion and sinus pain, sore throat, dysphagia,  hemoptysis , cough, dyspnea, wheezing, chest pain, palpitations, orthopnea, edema, abdominal pain, nausea, melena, diarrhea, constipation, flank pain, dysuria, hematuria, urinary  Frequency, nocturia, numbness, tingling, seizures,  Focal weakness, Loss of consciousness,  Tremor, insomnia, depression, anxiety, and suicidal ideation.     Objective:  BP 114/64 (BP Location: Left Arm, Patient Position: Sitting, Cuff Size: Normal)   Pulse (!) 57   Temp 98.2 F (36.8 C) (Oral)   Resp 14   Ht 5' 5.5" (1.664 m)   Wt 140 lb 9.6 oz (63.8 kg)   SpO2 98%   BMI 23.04 kg/m   Physical Exam   . General Appearance:    Alert, cooperative, no distress, appears stated age  Head:    Normocephalic, without obvious abnormality, atraumatic  Eyes:    PERRL, conjunctiva/corneas clear, EOM's intact, fundi    benign, both eyes  Ears:    Normal TM's and external ear canals, both ears  Nose:   Nares normal, septum midline, mucosa normal, no drainage    or sinus tenderness  Throat:   Lips, mucosa, and tongue normal; teeth and gums normal  Neck:   Supple, symmetrical, trachea midline, no adenopathy;     thyroid:  no enlargement/tenderness/nodules; no carotid   bruit or JVD  Back:     Symmetric, no curvature, ROM normal, no CVA tenderness  Lungs:     Clear to auscultation bilaterally, respirations unlabored  Chest Wall:    No tenderness or deformity   Heart:    Regular rate and rhythm, S1 and S2 normal, no murmur, rub   or gallop  Breast Exam:    No tenderness, masses, or nipple abnormality  Abdomen:     Soft, non-tender, bowel sounds active all four quadrants,    no masses, no organomegaly  Genitalia:    Pelvic: cervix normal in appearance, external genitalia normal, no adnexal masses or tenderness, no cervical motion tenderness, rectovaginal septum normal, uterus normal size, shape, and consistency and vagina normal without discharge  Extremities:   Extremities normal, atraumatic, no cyanosis or edema  Pulses:   2+ and symmetric all extremities  Skin:   Skin color, texture, turgor normal, no rashes or lesions  Lymph nodes:   Cervical, supraclavicular, and axillary nodes normal  Neurologic:   CNII-XII intact, normal strength, sensation and reflexes    throughout      Assessment & Plan:   Problem List Items Addressed This Visit    Varicose veins of both lower extremities with pain    S/p ablation of left leg in July ,  Right leg to be done in August ([place])      Hyperlipidemia    Based on current lipid profile, the risk of clinically significant Coronary artery disease is 4.8% over the next 10 years, using the Framingham risk calculator.  No medication needed   Lab Results  Component Value Date   CHOL 235 (H) 08/18/2016   HDL 78.70 08/18/2016   LDLCALC 141 (H) 08/18/2016   TRIG 76.0 08/18/2016   CHOLHDL 3 08/18/2016         Relevant Orders   Lipid panel (Completed)   Family history of alcoholism   Encounter for preventive health examination    Annual comprehensive preventive exam was done as well as an evaluation and management of chronic conditions .  During the  course of the visit the patient was educated and counseled about appropriate screening and preventive services including :  diabetes screening, lipid analysis with projected  10 year  risk for CAD , nutrition counseling, breast, cervical and colorectal cancer screening, and recommended immunizations.  Printed recommendations for health maintenance screenings was given      Bipolar disorder, unspecified ([place])    Managed with lamictal  by Royston Bake. CBC and liver enzymes are normal.    Lab Results  Component Value Date   ALT 14 08/18/2016   AST 17 08/18/2016   ALKPHOS 62 08/18/2016   BILITOT 0.7 08/18/2016    Lab Results  Component Value Date   WBC 6.2 08/18/2016   HGB 14.5 08/18/2016   HCT 44.1 08/18/2016   MCV 90.9 08/18/2016   PLT 314.0 08/18/2016          Other Visit Diagnoses    Encounter for screening for cervical cancer     -  Primary   Relevant Orders   Cytology - PAP (Completed)   Screening breast examination       Relevant Orders   MM SCREENING BREAST TOMO BILATERAL   Fatigue, unspecified type       Relevant Orders   Comprehensive metabolic panel (Completed)   TSH (Completed)   CBC with Differential/Platelet (Completed)   Vitamin D deficiency       Relevant Orders   VITAMIN D 25 Hydroxy (Vit-D Deficiency, Fractures) (Completed)      I have discontinued Ms. Hackler's Calcium-Magnesium-Vitamin D, amitriptyline, omega-3 acid ethyl esters, polyethylene glycol-electrolytes, and ALPRAZolam. I am also having her maintain her fish oil-omega-3 fatty acids, vitamin C, Vitamin D3, multivitamin with minerals, Misc Natural Products (GLUCOS-CHONDROIT-MSM COMPLEX PO), and lamoTRIgine.  Meds ordered this encounter  Medications  . lamoTRIgine (LAMICTAL) 100 MG tablet    Sig: Take 100 mg by mouth daily.    Medications Discontinued During This Encounter  Medication Reason  . ALPRAZolam (XANAX) 0.5 MG tablet Patient has not taken in last 30 days  . amitriptyline (ELAVIL)  50 MG tablet Patient has not taken in last 30 days  . Calcium-Magnesium-Vitamin D 622-29-798 MG-MG-UNIT TB24 Patient has not taken in last 30 days  . lamoTRIgine (LAMICTAL) 25 MG tablet Patient has not taken in last 30 days  . omega-3 acid ethyl esters (LOVAZA) 1 g capsule Patient has not taken in last 30 days  .  polyethylene glycol-electrolytes (NULYTELY/GOLYTELY) 420 g solution Patient has not taken in last 30 days    Follow-up: No Follow-up on file.   Crecencio Mc, MD

## 2016-08-18 NOTE — Patient Instructions (Signed)
Health Maintenance for Postmenopausal Women Menopause is a normal process in which your reproductive ability comes to an end. This process happens gradually over a span of months to years, usually between the ages of 22 and 9. Menopause is complete when you have missed 12 consecutive menstrual periods. It is important to talk with your health care provider about some of the most common conditions that affect postmenopausal women, such as heart disease, cancer, and bone loss (osteoporosis). Adopting a healthy lifestyle and getting preventive care can help to promote your health and wellness. Those actions can also lower your chances of developing some of these common conditions. What should I know about menopause? During menopause, you may experience a number of symptoms, such as:  Moderate-to-severe hot flashes.  Night sweats.  Decrease in sex drive.  Mood swings.  Headaches.  Tiredness.  Irritability.  Memory problems.  Insomnia.  Choosing to treat or not to treat menopausal changes is an individual decision that you make with your health care provider. What should I know about hormone replacement therapy and supplements? Hormone therapy products are effective for treating symptoms that are associated with menopause, such as hot flashes and night sweats. Hormone replacement carries certain risks, especially as you become older. If you are thinking about using estrogen or estrogen with progestin treatments, discuss the benefits and risks with your health care provider. What should I know about heart disease and stroke? Heart disease, heart attack, and stroke become more likely as you age. This may be due, in part, to the hormonal changes that your body experiences during menopause. These can affect how your body processes dietary fats, triglycerides, and cholesterol. Heart attack and stroke are both medical emergencies. There are many things that you can do to help prevent heart disease  and stroke:  Have your blood pressure checked at least every 1-2 years. High blood pressure causes heart disease and increases the risk of stroke.  If you are 53-22 years old, ask your health care provider if you should take aspirin to prevent a heart attack or a stroke.  Do not use any tobacco products, including cigarettes, chewing tobacco, or electronic cigarettes. If you need help quitting, ask your health care provider.  It is important to eat a healthy diet and maintain a healthy weight. ? Be sure to include plenty of vegetables, fruits, low-fat dairy products, and lean protein. ? Avoid eating foods that are high in solid fats, added sugars, or salt (sodium).  Get regular exercise. This is one of the most important things that you can do for your health. ? Try to exercise for at least 150 minutes each week. The type of exercise that you do should increase your heart rate and make you sweat. This is known as moderate-intensity exercise. ? Try to do strengthening exercises at least twice each week. Do these in addition to the moderate-intensity exercise.  Know your numbers.Ask your health care provider to check your cholesterol and your blood glucose. Continue to have your blood tested as directed by your health care provider.  What should I know about cancer screening? There are several types of cancer. Take the following steps to reduce your risk and to catch any cancer development as early as possible. Breast Cancer  Practice breast self-awareness. ? This means understanding how your breasts normally appear and feel. ? It also means doing regular breast self-exams. Let your health care provider know about any changes, no matter how small.  If you are 40  or older, have a clinician do a breast exam (clinical breast exam or CBE) every year. Depending on your age, family history, and medical history, it may be recommended that you also have a yearly breast X-ray (mammogram).  If you  have a family history of breast cancer, talk with your health care provider about genetic screening.  If you are at high risk for breast cancer, talk with your health care provider about having an MRI and a mammogram every year.  Breast cancer (BRCA) gene test is recommended for women who have family members with BRCA-related cancers. Results of the assessment will determine the need for genetic counseling and BRCA1 and for BRCA2 testing. BRCA-related cancers include these types: ? Breast. This occurs in males or females. ? Ovarian. ? Tubal. This may also be called fallopian tube cancer. ? Cancer of the abdominal or pelvic lining (peritoneal cancer). ? Prostate. ? Pancreatic.  Cervical, Uterine, and Ovarian Cancer Your health care provider may recommend that you be screened regularly for cancer of the pelvic organs. These include your ovaries, uterus, and vagina. This screening involves a pelvic exam, which includes checking for microscopic changes to the surface of your cervix (Pap test).  For women ages 21-65, health care providers may recommend a pelvic exam and a Pap test every three years. For women ages 79-65, they may recommend the Pap test and pelvic exam, combined with testing for human papilloma virus (HPV), every five years. Some types of HPV increase your risk of cervical cancer. Testing for HPV may also be done on women of any age who have unclear Pap test results.  Other health care providers may not recommend any screening for nonpregnant women who are considered low risk for pelvic cancer and have no symptoms. Ask your health care provider if a screening pelvic exam is right for you.  If you have had past treatment for cervical cancer or a condition that could lead to cancer, you need Pap tests and screening for cancer for at least 20 years after your treatment. If Pap tests have been discontinued for you, your risk factors (such as having a new sexual partner) need to be  reassessed to determine if you should start having screenings again. Some women have medical problems that increase the chance of getting cervical cancer. In these cases, your health care provider may recommend that you have screening and Pap tests more often.  If you have a family history of uterine cancer or ovarian cancer, talk with your health care provider about genetic screening.  If you have vaginal bleeding after reaching menopause, tell your health care provider.  There are currently no reliable tests available to screen for ovarian cancer.  Lung Cancer Lung cancer screening is recommended for adults 69-62 years old who are at high risk for lung cancer because of a history of smoking. A yearly low-dose CT scan of the lungs is recommended if you:  Currently smoke.  Have a history of at least 30 pack-years of smoking and you currently smoke or have quit within the past 15 years. A pack-year is smoking an average of one pack of cigarettes per day for one year.  Yearly screening should:  Continue until it has been 15 years since you quit.  Stop if you develop a health problem that would prevent you from having lung cancer treatment.  Colorectal Cancer  This type of cancer can be detected and can often be prevented.  Routine colorectal cancer screening usually begins at  age 42 and continues through age 45.  If you have risk factors for colon cancer, your health care provider may recommend that you be screened at an earlier age.  If you have a family history of colorectal cancer, talk with your health care provider about genetic screening.  Your health care provider may also recommend using home test kits to check for hidden blood in your stool.  A small camera at the end of a tube can be used to examine your colon directly (sigmoidoscopy or colonoscopy). This is done to check for the earliest forms of colorectal cancer.  Direct examination of the colon should be repeated every  5-10 years until age 71. However, if early forms of precancerous polyps or small growths are found or if you have a family history or genetic risk for colorectal cancer, you may need to be screened more often.  Skin Cancer  Check your skin from head to toe regularly.  Monitor any moles. Be sure to tell your health care provider: ? About any new moles or changes in moles, especially if there is a change in a mole's shape or color. ? If you have a mole that is larger than the size of a pencil eraser.  If any of your family members has a history of skin cancer, especially at a young age, talk with your health care provider about genetic screening.  Always use sunscreen. Apply sunscreen liberally and repeatedly throughout the day.  Whenever you are outside, protect yourself by wearing long sleeves, pants, a wide-brimmed hat, and sunglasses.  What should I know about osteoporosis? Osteoporosis is a condition in which bone destruction happens more quickly than new bone creation. After menopause, you may be at an increased risk for osteoporosis. To help prevent osteoporosis or the bone fractures that can happen because of osteoporosis, the following is recommended:  If you are 46-71 years old, get at least 1,000 mg of calcium and at least 600 mg of vitamin D per day.  If you are older than age 55 but younger than age 65, get at least 1,200 mg of calcium and at least 600 mg of vitamin D per day.  If you are older than age 54, get at least 1,200 mg of calcium and at least 800 mg of vitamin D per day.  Smoking and excessive alcohol intake increase the risk of osteoporosis. Eat foods that are rich in calcium and vitamin D, and do weight-bearing exercises several times each week as directed by your health care provider. What should I know about how menopause affects my mental health? Depression may occur at any age, but it is more common as you become older. Common symptoms of depression  include:  Low or sad mood.  Changes in sleep patterns.  Changes in appetite or eating patterns.  Feeling an overall lack of motivation or enjoyment of activities that you previously enjoyed.  Frequent crying spells.  Talk with your health care provider if you think that you are experiencing depression. What should I know about immunizations? It is important that you get and maintain your immunizations. These include:  Tetanus, diphtheria, and pertussis (Tdap) booster vaccine.  Influenza every year before the flu season begins.  Pneumonia vaccine.  Shingles vaccine.  Your health care provider may also recommend other immunizations. This information is not intended to replace advice given to you by your health care provider. Make sure you discuss any questions you have with your health care provider. Document Released: 02/19/2005  Document Revised: 07/18/2015 Document Reviewed: 10/01/2014 Elsevier Interactive Patient Education  2018 [employer]  

## 2016-08-19 LAB — CYTOLOGY - PAP
DIAGNOSIS: NEGATIVE
HPV: NOT DETECTED

## 2016-08-22 NOTE — Assessment & Plan Note (Signed)
S/p ablation of left leg in July ,  Right leg to be done in August (schnier)

## 2016-08-22 NOTE — Assessment & Plan Note (Signed)
Managed with lamictal  by Royston Bake. CBC and liver enzymes are normal.    Lab Results  Component Value Date   ALT 14 08/18/2016   AST 17 08/18/2016   ALKPHOS 62 08/18/2016   BILITOT 0.7 08/18/2016    Lab Results  Component Value Date   WBC 6.2 08/18/2016   HGB 14.5 08/18/2016   HCT 44.1 08/18/2016   MCV 90.9 08/18/2016   PLT 314.0 08/18/2016

## 2016-08-22 NOTE — Assessment & Plan Note (Signed)
Based on current lipid profile, the risk of clinically significant Coronary artery disease is 4.8% over the next 10 years, using the Framingham risk calculator.  No medication needed   Lab Results  Component Value Date   CHOL 235 (H) 08/18/2016   HDL 78.70 08/18/2016   LDLCALC 141 (H) 08/18/2016   TRIG 76.0 08/18/2016   CHOLHDL 3 08/18/2016

## 2016-08-22 NOTE — Assessment & Plan Note (Signed)
Annual comprehensive preventive exam was done as well as an evaluation and management of chronic conditions .  During the course of the visit the patient was educated and counseled about appropriate screening and preventive services including :  diabetes screening, lipid analysis with projected  10 year  risk for CAD , nutrition counseling, breast, cervical and colorectal cancer screening, and recommended immunizations.  Printed recommendations for health maintenance screenings was given 

## 2016-08-30 ENCOUNTER — Ambulatory Visit (INDEPENDENT_AMBULATORY_CARE_PROVIDER_SITE_OTHER): Payer: 59 | Admitting: Vascular Surgery

## 2016-08-30 ENCOUNTER — Encounter (INDEPENDENT_AMBULATORY_CARE_PROVIDER_SITE_OTHER): Payer: Self-pay | Admitting: Vascular Surgery

## 2016-08-30 VITALS — BP 121/74 | HR 57 | Resp 13 | Ht 65.5 in | Wt 141.0 lb

## 2016-08-30 DIAGNOSIS — I83813 Varicose veins of bilateral lower extremities with pain: Secondary | ICD-10-CM | POA: Diagnosis not present

## 2016-08-30 NOTE — Progress Notes (Signed)
    MRN : [id number]  Krystal Weaver is a 61 y.o. (1955/08/08) female who presents with chief complaint of  Chief Complaint  Patient presents with  . Venous Insufficiency    Right leg Laser ablation  .    The patient's right lower extremity was sterilely prepped and draped.  The ultrasound machine was used to visualize the great saphenous vein throughout its course.  A segment of the great saphenous vein was selected for access.  The saphenous vein was accessed without difficulty using ultrasound guidance with a micropuncture needle.   An 0.018  wire was placed beyond the saphenofemoral junction through the sheath and the microneedle was removed.  The 65 cm sheath was then placed over the wire and the wire and dilator were removed.  The laser fiber was placed through the sheath and its tip was placed approximately 2 cm below the saphenofemoral junction.  Tumescent anesthesia was then created with a dilute lidocaine solution.  Laser energy was then delivered with constant withdrawal of the sheath and laser fiber.  Approximately 1064 Joules of energy were delivered over a length of 44 cm.    The patient's leg was then repositioned. The accessory saphenous vein was then evaluated with ultrasound.   A segment above the knee was selected for access.  The saphenous vein was accessed without difficulty using ultrasound guidance with a micropuncture needle.   An 0.018  wire was placed beyond the saphenofemoral junction through the sheath and the microneedle was removed.  The 65 cm sheath was then placed over the wire and the wire and dilator were removed.  The laser fiber was placed through the sheath and its tip was placed approximately 2 cm below the saphenofemoral junction.  Tumescent anesthesia was then created with a dilute lidocaine solution.  Laser energy was then delivered with constant withdrawal of the sheath and laser fiber.  Approximately 520 Joules of energy were delivered over a length of 21  cm.    Sterile dressings were placed.  The patient tolerated the procedure well without complications.

## 2016-09-02 ENCOUNTER — Other Ambulatory Visit (INDEPENDENT_AMBULATORY_CARE_PROVIDER_SITE_OTHER): Payer: Self-pay | Admitting: Vascular Surgery

## 2016-09-02 DIAGNOSIS — I83813 Varicose veins of bilateral lower extremities with pain: Secondary | ICD-10-CM

## 2016-09-03 ENCOUNTER — Ambulatory Visit (INDEPENDENT_AMBULATORY_CARE_PROVIDER_SITE_OTHER): Payer: 59

## 2016-09-03 DIAGNOSIS — I83813 Varicose veins of bilateral lower extremities with pain: Secondary | ICD-10-CM | POA: Diagnosis not present

## 2016-09-08 ENCOUNTER — Ambulatory Visit
Admission: RE | Admit: 2016-09-08 | Discharge: 2016-09-08 | Disposition: A | Discharge status: Home / Self Care | Payer: 59 | Source: Ambulatory Visit | Attending: Internal Medicine | Admitting: Internal Medicine

## 2016-09-08 DIAGNOSIS — Z1231 Encounter for screening mammogram for malignant neoplasm of breast: Secondary | ICD-10-CM | POA: Insufficient documentation

## 2016-09-08 DIAGNOSIS — Z1239 Encounter for other screening for malignant neoplasm of breast: Secondary | ICD-10-CM

## 2016-10-11 DIAGNOSIS — F3181 Bipolar II disorder: Secondary | ICD-10-CM | POA: Diagnosis not present

## 2016-11-11 DIAGNOSIS — C801 Malignant (primary) neoplasm, unspecified: Secondary | ICD-10-CM

## 2016-11-11 HISTORY — DX: Malignant (primary) neoplasm, unspecified: C80.1

## 2017-04-11 DIAGNOSIS — F3181 Bipolar II disorder: Secondary | ICD-10-CM | POA: Diagnosis not present

## 2017-08-24 ENCOUNTER — Encounter: Payer: Self-pay | Admitting: Internal Medicine

## 2017-08-24 ENCOUNTER — Ambulatory Visit (INDEPENDENT_AMBULATORY_CARE_PROVIDER_SITE_OTHER): Payer: 59 | Admitting: Internal Medicine

## 2017-08-24 VITALS — BP 122/76 | HR 71 | Temp 98.2°F | Resp 14 | Ht 65.5 in | Wt 142.2 lb

## 2017-08-24 DIAGNOSIS — R5383 Other fatigue: Secondary | ICD-10-CM | POA: Diagnosis not present

## 2017-08-24 DIAGNOSIS — F3170 Bipolar disorder, currently in remission, most recent episode unspecified: Secondary | ICD-10-CM

## 2017-08-24 DIAGNOSIS — Z1231 Encounter for screening mammogram for malignant neoplasm of breast: Secondary | ICD-10-CM

## 2017-08-24 DIAGNOSIS — E782 Mixed hyperlipidemia: Secondary | ICD-10-CM | POA: Diagnosis not present

## 2017-08-24 DIAGNOSIS — Z0000 Encounter for general adult medical examination without abnormal findings: Secondary | ICD-10-CM | POA: Diagnosis not present

## 2017-08-24 DIAGNOSIS — Z1239 Encounter for other screening for malignant neoplasm of breast: Secondary | ICD-10-CM

## 2017-08-24 DIAGNOSIS — D126 Benign neoplasm of colon, unspecified: Secondary | ICD-10-CM

## 2017-08-24 LAB — LIPID PANEL
Cholesterol: 247 mg/dL — ABNORMAL HIGH (ref 0–200)
HDL: 74.7 mg/dL (ref >39.00)
LDL Cholesterol: 147 mg/dL — ABNORMAL HIGH (ref 0–99)
NONHDL: 171.96
Total CHOL/HDL Ratio: 3
Triglycerides: 127 mg/dL (ref 0.0–149.0)
VLDL: 25.4 mg/dL (ref 0.0–40.0)

## 2017-08-24 LAB — CBC WITH DIFFERENTIAL/PLATELET
BASOS ABS: 0 10*3/uL (ref 0.0–0.1)
BASOS PCT: 0.8 % (ref 0.0–3.0)
EOS ABS: 0.1 10*3/uL (ref 0.0–0.7)
Eosinophils Relative: 1.1 % (ref 0.0–5.0)
HCT: 43.2 % (ref 36.0–46.0)
Hemoglobin: 14.6 g/dL (ref 12.0–15.0)
LYMPHS ABS: 2.3 10*3/uL (ref 0.7–4.0)
Lymphocytes Relative: 35.4 % (ref 12.0–46.0)
MCHC: 33.7 g/dL (ref 30.0–36.0)
MCV: 89.6 fl (ref 78.0–100.0)
Monocytes Absolute: 0.4 10*3/uL (ref 0.1–1.0)
Monocytes Relative: 6.5 % (ref 3.0–12.0)
NEUTROS ABS: 3.6 10*3/uL (ref 1.4–7.7)
NEUTROS PCT: 56.2 % (ref 43.0–77.0)
PLATELETS: 273 10*3/uL (ref 150.0–400.0)
RBC: 4.81 Mil/uL (ref 3.87–5.11)
RDW: 12.8 % (ref 11.5–15.5)
WBC: 6.4 10*3/uL (ref 4.0–10.5)

## 2017-08-24 LAB — COMPREHENSIVE METABOLIC PANEL
ALK PHOS: 52 U/L (ref 39–117)
ALT: 13 U/L (ref 0–35)
AST: 19 U/L (ref 0–37)
Albumin: 4.8 g/dL (ref 3.5–5.2)
BILIRUBIN TOTAL: 0.6 mg/dL (ref 0.2–1.2)
BUN: 13 mg/dL (ref 6–23)
CO2: 32 mEq/L (ref 19–32)
CREATININE: 1.02 mg/dL (ref 0.40–1.20)
Calcium: 10.5 mg/dL (ref 8.4–10.5)
Chloride: 100 mEq/L (ref 96–112)
GFR: 58.22 mL/min — ABNORMAL LOW (ref >60.00)
GLUCOSE: 99 mg/dL (ref 70–99)
Potassium: 4 mEq/L (ref 3.5–5.1)
Sodium: 138 mEq/L (ref 135–145)
TOTAL PROTEIN: 7.1 g/dL (ref 6.0–8.3)

## 2017-08-24 LAB — TSH: TSH: 0.68 u[IU]/mL (ref 0.35–4.50)

## 2017-08-24 NOTE — Progress Notes (Signed)
Patient ID: Krystal Weaver, female    DOB: [birth year]  Age: 62 y.o. MRN: [id number]  The patient is here for annual preventive examination and management of other chronic and acute problems.  Last seen August 2018 for same Declines shingrx    The risk factors are reflected in the social history.  The roster of all physicians providing medical care to patient - is listed in the Snapshot section of the chart.  Activities of daily living:  The patient is 100% independent in all ADLs: dressing, toileting, feeding as well as independent mobility  Home safety : The patient has smoke detectors in the home. They wear seatbelts.  There are no firearms at home. There is no violence in the home.   There is no risks for hepatitis, STDs or HIV. There is no   history of blood transfusion. They have no travel history to infectious disease endemic areas of the world.  The patient has seen their dentist in the last six month. They have seen their eye doctor in the last year. They admit to slight hearing difficulty with regard to whispered voices and some television programs.  They have deferred audiologic testing in the last year.  They do not  have excessive sun exposure. Discussed the need for sun protection: hats, long sleeves and use of sunscreen if there is significant sun exposure.   Diet: the importance of a healthy diet is discussed. They do have a healthy diet.  The benefits of regular aerobic exercise were discussed. She walks 4 times per week ,  20 minutes.   Depression screen: there are no signs or vegative symptoms of depression- irritability, change in appetite, anhedonia, sadness/tearfullness.  Cognitive assessment: the patient manages all their financial and personal affairs and is actively engaged. They could relate day,date,year and events; recalled 2/3 objects at 3 minutes; performed clock-face test normally.  The following portions of the patient's history were reviewed and updated as  appropriate: allergies, current medications, past family history, past medical history,  past surgical history, past social history  and problem list.  Visual acuity was not assessed per patient preference since she has regular follow up with her ophthalmologist. Hearing and body mass index were assessed and reviewed.   During the course of the visit the patient was educated and counseled about appropriate screening and preventive services including : fall prevention , diabetes screening, nutrition counseling, colorectal cancer screening, and recommended immunizations.    CC: The primary encounter diagnosis was Mixed hyperlipidemia. Diagnoses of Fatigue, unspecified type, Breast cancer screening, Encounter for preventive health examination, Bipolar disorder in full remission, most recent episode unspecified type ([place]), and Tubular adenoma of colon were also pertinent to this visit.   Varicose vein procedure last AUGUST  Still seeing Lavine for bipolar disorder. Symptoms have been stable  Using melatonin for insomnia along with "Insight Timer"   History Fumiye has a past medical history of Depression.   She has a past surgical history that includes Breast surgery (Left, 2004); Breast cyst aspiration (1983); and Breast biopsy (Left, 1982).   Her family history includes Alcohol abuse in her mother, sister, and son; Breast cancer (age of onset: 3) in her sister; Breast cancer (age of onset: 30) in her maternal aunt; Cancer in her maternal aunt and sister; Cancer (age of onset: 88) in her father; Heart disease in her mother; Learning disabilities in her mother; Mental illness in her mother.She reports that she has never smoked. She has never used smokeless  tobacco. She reports that she drinks about 5.0 standard drinks of alcohol per week. She reports that she does not use drugs.  Outpatient Medications Prior to Visit  Medication Sig Dispense Refill  . Cholecalciferol (VITAMIN D3) 1000 UNITS CAPS  Take 1 capsule by mouth 2 (two) times daily.    . fish oil-omega-3 fatty acids 1000 MG capsule Take 1,200 g by mouth daily.    Marland Kitchen lamoTRIgine (LAMICTAL) 100 MG tablet Take 100 mg by mouth daily.    . Melatonin 5 MG CAPS Take 1 capsule by mouth daily.    . Misc Natural Products (GLUCOS-CHONDROIT-MSM COMPLEX PO) Take 1 capsule by mouth 2 (two) times daily.    . Multiple Vitamins-Minerals (MULTIVITAMIN WITH MINERALS) tablet Take 1 tablet by mouth daily.    . TURMERIC PO Take 1,000 mg by mouth daily.    . vitamin C (ASCORBIC ACID) 500 MG tablet Take 500 mg by mouth daily.     No facility-administered medications prior to visit.     Review of Systems   Patient denies headache, fevers, malaise, unintentional weight loss, skin rash, eye pain, sinus congestion and sinus pain, sore throat, dysphagia,  hemoptysis , cough, dyspnea, wheezing, chest pain, palpitations, orthopnea, edema, abdominal pain, nausea, melena, diarrhea, constipation, flank pain, dysuria, hematuria, urinary  Frequency, nocturia, numbness, tingling, seizures,  Focal weakness, Loss of consciousness,  Tremor, insomnia, depression, anxiety, and suicidal ideation.      Objective:  BP 122/76 (BP Location: Right Arm, Patient Position: Sitting, Cuff Size: Normal)   Pulse 71   Temp 98.2 F (36.8 C) (Oral)   Resp 14   Ht 5' 5.5" (1.664 m)   Wt 142 lb 3.2 oz (64.5 kg)   SpO2 98%   BMI 23.30 kg/m   Physical Exam  General appearance: alert, cooperative and appears stated age Head: Normocephalic, without obvious abnormality, atraumatic Eyes: conjunctivae/corneas clear. PERRL, EOM's intact. Fundi benign. Ears: normal TM's and external ear canals both ears Nose: Nares normal. Septum midline. Mucosa normal. No drainage or sinus tenderness. Throat: lips, mucosa, and tongue normal; teeth and gums normal Neck: no adenopathy, no carotid bruit, no JVD, supple, symmetrical, trachea midline and thyroid not enlarged, symmetric, no  tenderness/mass/nodules Lungs: clear to auscultation bilaterally Breasts: normal appearance, no masses or tenderness Heart: regular rate and rhythm, S1, S2 normal, no murmur, click, rub or gallop Abdomen: soft, non-tender; bowel sounds normal; no masses,  no organomegaly Extremities: extremities normal, atraumatic, no cyanosis or edema Pulses: 2+ and symmetric Skin: Skin color, texture, turgor normal. No rashes or lesions Neurologic: Alert and oriented X 3, normal strength and tone. Normal symmetric reflexes. Normal coordination and gait.     Assessment & Plan:   Problem List Items Addressed This Visit    Bipolar disorder, unspecified ([place])    Stable  Managed by Dr Octavia Heir with Lithium .  Renal and liver function normmal  Lab Results  Component Value Date   CREATININE 1.02 08/24/2017   Lab Results  Component Value Date   ALT 13 08/24/2017   AST 19 08/24/2017   ALKPHOS 52 08/24/2017   BILITOT 0.6 08/24/2017         Encounter for preventive health examination    Annual comprehensive preventive exam was done as well as an evaluation and management of chronic conditions .  During the course of the visit the patient was educated and counseled about appropriate screening and preventive services including :  diabetes screening, lipid analysis with projected  10 year  risk for CAD , nutrition counseling, breast, cervical and colorectal cancer screening, and recommended immunizations.  Printed recommendations for health maintenance screenings was given.  Mammogram ordered.  PAP and colonoscopy are due in 2021 and 2023 respectively.       Hyperlipidemia - Primary    Based on current lipid profile, the risk of clinically significant Coronary artery disease is 6,3 % over the next 10 years, using the Framingham risk calculator.  No medication needed   Lab Results  Component Value Date   CHOL 247 (H) 08/24/2017   HDL 74.70 08/24/2017   LDLCALC 147 (H) 08/24/2017   TRIG 127.0 08/24/2017    CHOLHDL 3 08/24/2017   2      Relevant Orders   Lipid panel (Completed)   TSH (Completed)   Tubular adenoma of colon    By Feb 2018 colonoscopy.  Follow up due in 2023       Other Visit Diagnoses    Fatigue, unspecified type       Relevant Orders   Comprehensive metabolic panel (Completed)   CBC with Differential/Platelet (Completed)   Hepatitis C antibody (Completed)   HIV antibody (Completed)   Breast cancer screening       Relevant Orders   MM 3D SCREEN BREAST BILATERAL      I am having Roselyn F. Kucharski maintain her fish oil-omega-3 fatty acids, vitamin C, Vitamin D3, multivitamin with minerals, Misc Natural Products (GLUCOS-CHONDROIT-MSM COMPLEX PO), lamoTRIgine, TURMERIC PO, and Melatonin.  No orders of the defined types were placed in this encounter.   There are no discontinued medications.  Follow-up: No follow-ups on file.   Crecencio Mc, MD

## 2017-08-24 NOTE — Patient Instructions (Addendum)
Your mammogram has been ordered.  You can call to make your appointment at [facility]  We will repeat your PAP smear next year    Health Maintenance for Postmenopausal Women Menopause is a normal process in which your reproductive ability comes to an end. This process happens gradually over a span of months to years, usually between the ages of 90 and 59. Menopause is complete when you have missed 12 consecutive menstrual periods. It is important to talk with your health care provider about some of the most common conditions that affect postmenopausal women, such as heart disease, cancer, and bone loss (osteoporosis). Adopting a healthy lifestyle and getting preventive care can help to promote your health and wellness. Those actions can also lower your chances of developing some of these common conditions. What should I know about menopause? During menopause, you may experience a number of symptoms, such as:  Moderate-to-severe hot flashes.  Night sweats.  Decrease in sex drive.  Mood swings.  Headaches.  Tiredness.  Irritability.  Memory problems.  Insomnia.  Choosing to treat or not to treat menopausal changes is an individual decision that you make with your health care provider. What should I know about hormone replacement therapy and supplements? Hormone therapy products are effective for treating symptoms that are associated with menopause, such as hot flashes and night sweats. Hormone replacement carries certain risks, especially as you become older. If you are thinking about using estrogen or estrogen with progestin treatments, discuss the benefits and risks with your health care provider. What should I know about heart disease and stroke? Heart disease, heart attack, and stroke become more likely as you age. This may be due, in part, to the hormonal changes that your body experiences during menopause. These can affect how your body processes dietary fats,  triglycerides, and cholesterol. Heart attack and stroke are both medical emergencies. There are many things that you can do to help prevent heart disease and stroke:  Have your blood pressure checked at least every 1-2 years. High blood pressure causes heart disease and increases the risk of stroke.  If you are 30-86 years old, ask your health care provider if you should take aspirin to prevent a heart attack or a stroke.  Do not use any tobacco products, including cigarettes, chewing tobacco, or electronic cigarettes. If you need help quitting, ask your health care provider.  It is important to eat a healthy diet and maintain a healthy weight. ? Be sure to include plenty of vegetables, fruits, low-fat dairy products, and lean protein. ? Avoid eating foods that are high in solid fats, added sugars, or salt (sodium).  Get regular exercise. This is one of the most important things that you can do for your health. ? Try to exercise for at least 150 minutes each week. The type of exercise that you do should increase your heart rate and make you sweat. This is known as moderate-intensity exercise. ? Try to do strengthening exercises at least twice each week. Do these in addition to the moderate-intensity exercise.  Know your numbers.Ask your health care provider to check your cholesterol and your blood glucose. Continue to have your blood tested as directed by your health care provider.  What should I know about cancer screening? There are several types of cancer. Take the following steps to reduce your risk and to catch any cancer development as early as possible. Breast Cancer  Practice breast self-awareness. ? This means understanding how your breasts  normally appear and feel. ? It also means doing regular breast self-exams. Let your health care provider know about any changes, no matter how small.  If you are 12 or older, have a clinician do a breast exam (clinical breast exam or CBE)  every year. Depending on your age, family history, and medical history, it may be recommended that you also have a yearly breast X-ray (mammogram).  If you have a family history of breast cancer, talk with your health care provider about genetic screening.  If you are at high risk for breast cancer, talk with your health care provider about having an MRI and a mammogram every year.  Breast cancer (BRCA) gene test is recommended for women who have family members with BRCA-related cancers. Results of the assessment will determine the need for genetic counseling and BRCA1 and for BRCA2 testing. BRCA-related cancers include these types: ? Breast. This occurs in males or females. ? Ovarian. ? Tubal. This may also be called fallopian tube cancer. ? Cancer of the abdominal or pelvic lining (peritoneal cancer). ? Prostate. ? Pancreatic.  Cervical, Uterine, and Ovarian Cancer Your health care provider may recommend that you be screened regularly for cancer of the pelvic organs. These include your ovaries, uterus, and vagina. This screening involves a pelvic exam, which includes checking for microscopic changes to the surface of your cervix (Pap test).  For women ages 21-65, health care providers may recommend a pelvic exam and a Pap test every three years. For women ages 74-65, they may recommend the Pap test and pelvic exam, combined with testing for human papilloma virus (HPV), every five years. Some types of HPV increase your risk of cervical cancer. Testing for HPV may also be done on women of any age who have unclear Pap test results.  Other health care providers may not recommend any screening for nonpregnant women who are considered low risk for pelvic cancer and have no symptoms. Ask your health care provider if a screening pelvic exam is right for you.  If you have had past treatment for cervical cancer or a condition that could lead to cancer, you need Pap tests and screening for cancer for at  least 20 years after your treatment. If Pap tests have been discontinued for you, your risk factors (such as having a new sexual partner) need to be reassessed to determine if you should start having screenings again. Some women have medical problems that increase the chance of getting cervical cancer. In these cases, your health care provider may recommend that you have screening and Pap tests more often.  If you have a family history of uterine cancer or ovarian cancer, talk with your health care provider about genetic screening.  If you have vaginal bleeding after reaching menopause, tell your health care provider.  There are currently no reliable tests available to screen for ovarian cancer.  Lung Cancer Lung cancer screening is recommended for adults 42-24 years old who are at high risk for lung cancer because of a history of smoking. A yearly low-dose CT scan of the lungs is recommended if you:  Currently smoke.  Have a history of at least 30 pack-years of smoking and you currently smoke or have quit within the past 15 years. A pack-year is smoking an average of one pack of cigarettes per day for one year.  Yearly screening should:  Continue until it has been 15 years since you quit.  Stop if you develop a health problem that would prevent  you from having lung cancer treatment.  Colorectal Cancer  This type of cancer can be detected and can often be prevented.  Routine colorectal cancer screening usually begins at age 54 and continues through age 29.  If you have risk factors for colon cancer, your health care provider may recommend that you be screened at an earlier age.  If you have a family history of colorectal cancer, talk with your health care provider about genetic screening.  Your health care provider may also recommend using home test kits to check for hidden blood in your stool.  A small camera at the end of a tube can be used to examine your colon directly  (sigmoidoscopy or colonoscopy). This is done to check for the earliest forms of colorectal cancer.  Direct examination of the colon should be repeated every 5-10 years until age 69. However, if early forms of precancerous polyps or small growths are found or if you have a family history or genetic risk for colorectal cancer, you may need to be screened more often.  Skin Cancer  Check your skin from head to toe regularly.  Monitor any moles. Be sure to tell your health care provider: ? About any new moles or changes in moles, especially if there is a change in a mole's shape or color. ? If you have a mole that is larger than the size of a pencil eraser.  If any of your family members has a history of skin cancer, especially at a young age, talk with your health care provider about genetic screening.  Always use sunscreen. Apply sunscreen liberally and repeatedly throughout the day.  Whenever you are outside, protect yourself by wearing long sleeves, pants, a wide-brimmed hat, and sunglasses.  What should I know about osteoporosis? Osteoporosis is a condition in which bone destruction happens more quickly than new bone creation. After menopause, you may be at an increased risk for osteoporosis. To help prevent osteoporosis or the bone fractures that can happen because of osteoporosis, the following is recommended:  If you are 73-23 years old, get at least 1,000 mg of calcium and at least 600 mg of vitamin D per day.  If you are older than age 21 but younger than age 27, get at least 1,200 mg of calcium and at least 600 mg of vitamin D per day.  If you are older than age 52, get at least 1,200 mg of calcium and at least 800 mg of vitamin D per day.  Smoking and excessive alcohol intake increase the risk of osteoporosis. Eat foods that are rich in calcium and vitamin D, and do weight-bearing exercises several times each week as directed by your health care provider. What should I know about  how menopause affects my mental health? Depression may occur at any age, but it is more common as you become older. Common symptoms of depression include:  Low or sad mood.  Changes in sleep patterns.  Changes in appetite or eating patterns.  Feeling an overall lack of motivation or enjoyment of activities that you previously enjoyed.  Frequent crying spells.  Talk with your health care provider if you think that you are experiencing depression. What should I know about immunizations? It is important that you get and maintain your immunizations. These include:  Tetanus, diphtheria, and pertussis (Tdap) booster vaccine.  Influenza every year before the flu season begins.  Pneumonia vaccine.  Shingles vaccine.  Your health care provider may also recommend other immunizations. This information  is not intended to replace advice given to you by your health care provider. Make sure you discuss any questions you have with your health care provider. Document Released: 02/19/2005 Document Revised: 07/18/2015 Document Reviewed: 10/01/2014 Elsevier Interactive Patient Education  2018 [employer].

## 2017-08-25 DIAGNOSIS — D126 Benign neoplasm of colon, unspecified: Secondary | ICD-10-CM | POA: Insufficient documentation

## 2017-08-25 LAB — HIV ANTIBODY (ROUTINE TESTING W REFLEX): HIV 1&2 Ab, 4th Generation: NONREACTIVE

## 2017-08-25 LAB — HEPATITIS C ANTIBODY
Hepatitis C Ab: NONREACTIVE
SIGNAL TO CUT-OFF: 0.01 (ref <1.00)

## 2017-08-25 NOTE — Assessment & Plan Note (Addendum)
Annual comprehensive preventive exam was done as well as an evaluation and management of chronic conditions .  During the course of the visit the patient was educated and counseled about appropriate screening and preventive services including :  diabetes screening, lipid analysis with projected  10 year  risk for CAD , nutrition counseling, breast, cervical and colorectal cancer screening, and recommended immunizations.  Printed recommendations for health maintenance screenings was given.  Mammogram ordered.  PAP and colonoscopy are due in 2021 and 2023 respectively.

## 2017-08-25 NOTE — Assessment & Plan Note (Signed)
Based on current lipid profile, the risk of clinically significant Coronary artery disease is 6,3 % over the next 10 years, using the Framingham risk calculator.  No medication needed   Lab Results  Component Value Date   CHOL 247 (H) 08/24/2017   HDL 74.70 08/24/2017   LDLCALC 147 (H) 08/24/2017   TRIG 127.0 08/24/2017   CHOLHDL 3 08/24/2017   2

## 2017-08-25 NOTE — Assessment & Plan Note (Addendum)
Stable  Managed by Dr Octavia Heir with Lithium .  Renal and liver function normmal  Lab Results  Component Value Date   CREATININE 1.02 08/24/2017   Lab Results  Component Value Date   ALT 13 08/24/2017   AST 19 08/24/2017   ALKPHOS 52 08/24/2017   BILITOT 0.6 08/24/2017

## 2017-08-25 NOTE — Assessment & Plan Note (Signed)
By Feb 2018 colonoscopy.  Follow up due in 2023

## 2017-09-14 ENCOUNTER — Ambulatory Visit
Admission: RE | Admit: 2017-09-14 | Discharge: 2017-09-14 | Disposition: A | Discharge status: Home / Self Care | Payer: 59 | Source: Ambulatory Visit | Attending: Internal Medicine | Admitting: Internal Medicine

## 2017-09-14 DIAGNOSIS — Z1239 Encounter for other screening for malignant neoplasm of breast: Secondary | ICD-10-CM

## 2017-09-14 DIAGNOSIS — Z1231 Encounter for screening mammogram for malignant neoplasm of breast: Secondary | ICD-10-CM | POA: Diagnosis not present

## 2017-10-05 DIAGNOSIS — H524 Presbyopia: Secondary | ICD-10-CM | POA: Diagnosis not present

## 2017-11-11 DIAGNOSIS — F3181 Bipolar II disorder: Secondary | ICD-10-CM | POA: Diagnosis not present

## 2017-11-11 HISTORY — PX: MELANOMA EXCISION: SHX5266

## 2017-11-16 DIAGNOSIS — L578 Other skin changes due to chronic exposure to nonionizing radiation: Secondary | ICD-10-CM | POA: Diagnosis not present

## 2017-11-16 DIAGNOSIS — I8393 Asymptomatic varicose veins of bilateral lower extremities: Secondary | ICD-10-CM | POA: Diagnosis not present

## 2017-11-16 DIAGNOSIS — D1800 Hemangioma unspecified site: Secondary | ICD-10-CM | POA: Diagnosis not present

## 2017-11-16 DIAGNOSIS — D0371 Melanoma in situ of right lower limb, including hip: Secondary | ICD-10-CM | POA: Diagnosis not present

## 2017-11-16 DIAGNOSIS — Z1283 Encounter for screening for malignant neoplasm of skin: Secondary | ICD-10-CM | POA: Diagnosis not present

## 2017-11-16 DIAGNOSIS — L821 Other seborrheic keratosis: Secondary | ICD-10-CM | POA: Diagnosis not present

## 2017-11-16 DIAGNOSIS — D2239 Melanocytic nevi of other parts of face: Secondary | ICD-10-CM | POA: Diagnosis not present

## 2017-11-16 DIAGNOSIS — D485 Neoplasm of uncertain behavior of skin: Secondary | ICD-10-CM | POA: Diagnosis not present

## 2017-11-29 DIAGNOSIS — C439 Malignant melanoma of skin, unspecified: Secondary | ICD-10-CM

## 2017-11-29 DIAGNOSIS — L905 Scar conditions and fibrosis of skin: Secondary | ICD-10-CM | POA: Diagnosis not present

## 2017-11-29 DIAGNOSIS — D0371 Melanoma in situ of right lower limb, including hip: Secondary | ICD-10-CM | POA: Diagnosis not present

## 2017-11-29 HISTORY — DX: Malignant melanoma of skin, unspecified: C43.9

## 2017-12-06 DIAGNOSIS — D0371 Melanoma in situ of right lower limb, including hip: Secondary | ICD-10-CM | POA: Diagnosis not present

## 2018-03-29 DIAGNOSIS — L578 Other skin changes due to chronic exposure to nonionizing radiation: Secondary | ICD-10-CM | POA: Diagnosis not present

## 2018-03-29 DIAGNOSIS — I8393 Asymptomatic varicose veins of bilateral lower extremities: Secondary | ICD-10-CM | POA: Diagnosis not present

## 2018-03-29 DIAGNOSIS — D1800 Hemangioma unspecified site: Secondary | ICD-10-CM | POA: Diagnosis not present

## 2018-03-29 DIAGNOSIS — Z85820 Personal history of malignant melanoma of skin: Secondary | ICD-10-CM | POA: Diagnosis not present

## 2018-03-29 DIAGNOSIS — D2221 Melanocytic nevi of right ear and external auricular canal: Secondary | ICD-10-CM | POA: Diagnosis not present

## 2018-03-29 DIAGNOSIS — D2239 Melanocytic nevi of other parts of face: Secondary | ICD-10-CM | POA: Diagnosis not present

## 2018-03-29 DIAGNOSIS — Z1283 Encounter for screening for malignant neoplasm of skin: Secondary | ICD-10-CM | POA: Diagnosis not present

## 2018-03-29 DIAGNOSIS — L821 Other seborrheic keratosis: Secondary | ICD-10-CM | POA: Diagnosis not present

## 2018-05-01 DIAGNOSIS — F3181 Bipolar II disorder: Secondary | ICD-10-CM | POA: Diagnosis not present

## 2018-06-28 DIAGNOSIS — L578 Other skin changes due to chronic exposure to nonionizing radiation: Secondary | ICD-10-CM | POA: Diagnosis not present

## 2018-06-28 DIAGNOSIS — D239 Other benign neoplasm of skin, unspecified: Secondary | ICD-10-CM

## 2018-06-28 DIAGNOSIS — Z1283 Encounter for screening for malignant neoplasm of skin: Secondary | ICD-10-CM | POA: Diagnosis not present

## 2018-06-28 DIAGNOSIS — D229 Melanocytic nevi, unspecified: Secondary | ICD-10-CM | POA: Diagnosis not present

## 2018-06-28 DIAGNOSIS — L821 Other seborrheic keratosis: Secondary | ICD-10-CM | POA: Diagnosis not present

## 2018-06-28 DIAGNOSIS — I781 Nevus, non-neoplastic: Secondary | ICD-10-CM | POA: Diagnosis not present

## 2018-06-28 DIAGNOSIS — D2262 Melanocytic nevi of left upper limb, including shoulder: Secondary | ICD-10-CM | POA: Diagnosis not present

## 2018-06-28 DIAGNOSIS — D1800 Hemangioma unspecified site: Secondary | ICD-10-CM | POA: Diagnosis not present

## 2018-06-28 DIAGNOSIS — D2261 Melanocytic nevi of right upper limb, including shoulder: Secondary | ICD-10-CM | POA: Diagnosis not present

## 2018-06-28 DIAGNOSIS — Z85820 Personal history of malignant melanoma of skin: Secondary | ICD-10-CM | POA: Diagnosis not present

## 2018-06-28 DIAGNOSIS — D485 Neoplasm of uncertain behavior of skin: Secondary | ICD-10-CM | POA: Diagnosis not present

## 2018-06-28 DIAGNOSIS — I8393 Asymptomatic varicose veins of bilateral lower extremities: Secondary | ICD-10-CM | POA: Diagnosis not present

## 2018-06-28 HISTORY — DX: Other benign neoplasm of skin, unspecified: D23.9

## 2018-08-12 ENCOUNTER — Other Ambulatory Visit: Payer: Self-pay

## 2018-08-12 ENCOUNTER — Encounter: Payer: Self-pay | Admitting: Emergency Medicine

## 2018-08-12 ENCOUNTER — Ambulatory Visit
Admission: EM | Admit: 2018-08-12 | Discharge: 2018-08-12 | Disposition: A | Discharge status: Home / Self Care | Payer: 59 | Attending: Urgent Care | Admitting: Urgent Care

## 2018-08-12 DIAGNOSIS — B029 Zoster without complications: Secondary | ICD-10-CM

## 2018-08-12 HISTORY — DX: Zoster without complications: B02.9

## 2018-08-12 MED ORDER — VALACYCLOVIR HCL 1 G PO TABS: 1000.0000 mg | ORAL_TABLET | Freq: Three times a day (TID) | ORAL | 0 refills | Status: DC

## 2018-08-12 NOTE — ED Triage Notes (Signed)
Patient c/o itchy, raised rash on her left side that wraps around her back x 3 days ago. Denies fever

## 2018-08-12 NOTE — ED Provider Notes (Signed)
[place]   Name: Krystal Weaver DOB: [birth year] MRN: [id number] CSN: [id number] PCP: Crecencio Mc, MD  Arrival date and time:  08/12/18 1046  Chief Complaint:  Rash   NOTE: Prior to seeing the patient today, I have reviewed the triage nursing documentation and vital signs. Clinical staff has updated patient's PMH/PSHx, current medication list, and drug allergies/intolerances to ensure comprehensive history available to assist in medical decision making.   History:   HPI: Krystal Weaver is a 63 y.o. female who presents today with complaints of rash to the RIGHT side of her upper back  that initially declared on Wednesday (0729/2020). Rash started to wrap around her RIGHT lateral torso, following a single dermatomal patten, yesterday. Patient denies any new medications, foods, soaps/body washes, laundry detergents, or cosmetics. She doesn't recall being bitten by any insects, although area of concern was initially thought to be caused by mosquito bites. She denies a past medical history significant for environmental allergies. She advises that she developed a skin eruption like this 10 years ago during a "really stressful time". She was  diagnosed and treated for shingles at that time.   Presenting rash today is erythematous and pruritic. She has not appreciated any pain (burning) or drainage associated with the rash. There is no facial involvement; no rash to periorbital, paranasal, or perioral areas. Patient denies that she is experiencing any shortness of breath or sensation of pharyngeal/laryngeal fullness. 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 She notes increased situational stress related to current COVID-19 pandemic. She denies any known exposures to anyone with concerning skin conditions/eruptions.    In efforts to help reduce/relieve the associated pruritis, the patient advising that she has used ibuprofen and diphenhydramine at home. Patient  notes that conservative symptomatic management at home has been effective overall.   Past Medical History:  Diagnosis Date  . Depression    Bi polar type 2    Past Surgical History:  Procedure Laterality Date  . BREAST CYST ASPIRATION  1983   Negative  . BREAST EXCISIONAL BIOPSY Left 1982   Negative - Excisional  . BREAST SURGERY Left 2004  . MELANOMA EXCISION      Family History  Problem Relation Age of Onset  . Alcohol abuse Mother   . Heart disease Mother   . Learning disabilities Mother   . Mental illness Mother   . Cancer Father 73       brain tumor  . Alcohol abuse Sister   . Cancer Sister   . Alcohol abuse Son   . Breast cancer Sister 26  . Cancer Maternal Aunt   . Breast cancer Maternal Aunt 68    Social History   Tobacco Use  . Smoking status: Never Smoker  . Smokeless tobacco: Never Used  Substance Use Topics  . Alcohol use: Yes    Alcohol/week: 5.0 standard drinks    Types: 5 Glasses of wine per week  . Drug use: No    Patient Active Problem List   Diagnosis Date Noted  . Tubular adenoma of colon 08/25/2017  . Family history of alcoholism 08/18/2016  . Varicose veins of both lower extremities with pain 11/03/2015  . Chronic venous insufficiency 11/03/2015  . Hyperlipidemia 02/20/2015  . Bipolar disorder, unspecified ([place]) 08/24/2012  . Encounter for preventive health examination 08/24/2012    Home Medications:    Current Meds  Medication Sig  . Cholecalciferol (VITAMIN D3) 1000 UNITS CAPS  Take 1 capsule by mouth 2 (two) times daily.  . fish oil-omega-3 fatty acids 1000 MG capsule Take 1,200 g by mouth daily.  Marland Kitchen lamoTRIgine (LAMICTAL) 100 MG tablet Take 100 mg by mouth daily.  . Melatonin 5 MG CAPS Take 1 capsule by mouth daily.  . Misc Natural Products (GLUCOS-CHONDROIT-MSM COMPLEX PO) Take 1 capsule by mouth 2 (two) times daily.  . Multiple Vitamins-Minerals (MULTIVITAMIN WITH MINERALS) tablet Take 1 tablet by mouth daily.  . vitamin C  (ASCORBIC ACID) 500 MG tablet Take 500 mg by mouth daily.    Allergies:   Patient has no known allergies.  Review of Systems (ROS): Review of Systems  Constitutional: Negative for chills and fever.  HENT: Negative for drooling and trouble swallowing.   Respiratory: Negative for cough and shortness of breath.   Cardiovascular: Negative for chest pain and palpitations.  Skin: Positive for color change and rash.  Neurological: Negative for dizziness, syncope, weakness, numbness and headaches.  Hematological: Negative for adenopathy.  All other systems reviewed and are negative.    Vital Signs: Today's Vitals   08/12/18 1059 08/12/18 1103 08/12/18 1123  BP:  122/84   Pulse:  63   Resp:  18   Temp:  98.5 F (36.9 C)   TempSrc:  Oral   Weight: 145 lb (65.8 kg)    Height: 5\' 5"  (1.651 m)    PainSc: 0-No pain  0-No pain    Physical Exam: Physical Exam  Constitutional: She is oriented to person, place, and time and well-developed, well-nourished, and in no distress.  HENT:  Head: Normocephalic and atraumatic.  Mouth/Throat: Mucous membranes are normal.  Eyes: Pupils are equal, round, and reactive to light. EOM are normal.  Cardiovascular: Normal rate.  Pulmonary/Chest: Effort normal. No respiratory distress.  Neurological: She is alert and oriented to person, place, and time. Gait normal. GCS score is 15.  Skin: Skin is warm and dry. Rash noted. Rash is vesicular.     Psychiatric: Mood, memory, affect and judgment normal.  Nursing note and vitals reviewed.   Urgent Care Treatments / Results:   LABS: PLEASE NOTE: all labs that were ordered this encounter are listed, however only abnormal results are displayed. Labs Reviewed - No data to display  EKG: -None  RADIOLOGY: No results found.  PROCEDURES: Procedures  MEDICATIONS RECEIVED THIS VISIT: Medications - No data to display  PERTINENT CLINICAL COURSE NOTES/UPDATES:   Initial Impression / Assessment and Plan  / Urgent Care Course:  Pertinent labs & imaging results that were available during my care of the patient were personally reviewed by me and considered in my medical decision making (see lab/imaging section of note for values and interpretations).  DANIJELA VESSEY is a 63 y.o. female who presents to [facility] today with complaints of Rash   Patient is well appearing overall in clinic today. She does not appear to be in any acute distress. Presenting symptoms (see HPI) and exam as documented above. Presenting complaints and exam consistent with HZV rash. Will treat with a 7 day course of valacyclovir. She notes that pain has been managed thus far with APAP and IBU, which she will continue. She was advised to return a call to the clinic if she develops pain that is not managed with theses interventions. She was educated on the fact that she is considered contagious until lesions/rashh dries out and crusted over. 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 She  was directed to contact [facility] to discuss options related to her being cleared to work with areas covered.   Discussed follow up with primary care physician in 1 week for re-evaluation if not improving. I have reviewed the follow up and strict return precautions for any new or worsening symptoms. Patient is aware of symptoms that would be deemed urgent/emergent, and would thus require further evaluation either here or in the emergency department. At the time of discharge, she verbalized understanding and consent with the discharge plan as it was reviewed with her. All questions were fielded by provider and/or clinic staff prior to patient discharge.    Final Clinical Impressions / Urgent Care Diagnoses:   Final diagnoses:  Herpes zoster without complication    New Prescriptions:  [place] Controlled Substance Registry consulted? Not Applicable  Meds ordered this encounter  Medications  .  valACYclovir (VALTREX) 1000 MG tablet    Sig: Take 1 tablet (1,000 mg total) by mouth 3 (three) times daily.    Dispense:  21 tablet    Refill:  0    Recommended Follow up Care:  Patient encouraged to follow up with the following provider within the specified time frame, or sooner as dictated by the severity of her symptoms. As always, she was instructed that for any urgent/emergent care needs, she should seek care either here or in the emergency department for more immediate evaluation.  Follow-up Information    Crecencio Mc, MD In 1 week.   Specialty: Internal Medicine Why: General reassessment of symptoms if not improving Contact information: [address] [address] [phone]         NOTE: This note was prepared using Dragon dictation software along with smaller phrase technology. Despite my best ability to proofread, there is the potential that transcriptional errors may still occur from this process, and are completely unintentional.    Karen Kitchens, NP 08/13/18 1705

## 2018-08-12 NOTE — Discharge Instructions (Addendum)
It was very nice seeing you today in clinic. Thank you for entrusting me with your care.   You have shingles. You are considered contagious until the lesions dry and crust over. Please let [facility]) know and let them determine whether or not you are cleared to work with areas covered.   I have sent in the antiviral medication (Valtrex). Please utilize the medications that we discussed. Your prescriptions have been called in to your pharmacy. May use Tylenol and/or Ibuprofen as needed for pain. Let us know if you pain is severe and you need something different.   Make arrangements to follow up with your regular doctor in 1 week for re-evaluation if not improving. If your symptoms/condition worsens, please seek follow up care either here or in the ER. Please remember, our [place] providers are "right here with you" when you need [place].   Again, it was my pleasure to take care of you today. Thank you for choosing our clinic. I hope that you start to feel better quickly.   Honor Loh, MSN, APRN, FNP-C, CEN Advanced Practice Provider [facility]

## 2018-08-31 ENCOUNTER — Telehealth: Payer: Self-pay

## 2018-08-31 NOTE — Telephone Encounter (Signed)
Copied from [place]. Topic: General - Other >> Aug 31, 2018 12:26 PM Keene Breath wrote: Reason for CRM: Patient called to inform the office that she would prefer to have her next appt. In office.

## 2018-09-04 ENCOUNTER — Encounter: Payer: Self-pay | Admitting: Internal Medicine

## 2018-10-04 DIAGNOSIS — I8393 Asymptomatic varicose veins of bilateral lower extremities: Secondary | ICD-10-CM | POA: Diagnosis not present

## 2018-10-04 DIAGNOSIS — Z1283 Encounter for screening for malignant neoplasm of skin: Secondary | ICD-10-CM | POA: Diagnosis not present

## 2018-10-04 DIAGNOSIS — D239 Other benign neoplasm of skin, unspecified: Secondary | ICD-10-CM | POA: Diagnosis not present

## 2018-10-04 DIAGNOSIS — Z85820 Personal history of malignant melanoma of skin: Secondary | ICD-10-CM | POA: Diagnosis not present

## 2018-10-07 ENCOUNTER — Emergency Department: Payer: 59

## 2018-10-07 ENCOUNTER — Emergency Department
Admission: EM | Admit: 2018-10-07 | Discharge: 2018-10-07 | Disposition: A | Discharge status: Home / Self Care | Payer: 59 | Attending: Emergency Medicine | Admitting: Emergency Medicine

## 2018-10-07 ENCOUNTER — Encounter: Payer: Self-pay | Admitting: Emergency Medicine

## 2018-10-07 ENCOUNTER — Other Ambulatory Visit: Payer: Self-pay

## 2018-10-07 DIAGNOSIS — R000 Tachycardia, unspecified: Secondary | ICD-10-CM | POA: Diagnosis not present

## 2018-10-07 DIAGNOSIS — I471 Supraventricular tachycardia: Secondary | ICD-10-CM | POA: Insufficient documentation

## 2018-10-07 LAB — TROPONIN I (HIGH SENSITIVITY): Troponin I (High Sensitivity): 18 ng/L — ABNORMAL HIGH (ref <18)

## 2018-10-07 LAB — BASIC METABOLIC PANEL
Anion gap: 11 (ref 5–15)
BUN: 13 mg/dL (ref 8–23)
CO2: 26 mmol/L (ref 22–32)
Calcium: 9.8 mg/dL (ref 8.9–10.3)
Chloride: 101 mmol/L (ref 98–111)
Creatinine, Ser: 1 mg/dL (ref 0.44–1.00)
GFR calc Af Amer: >60 mL/min (ref >60)
GFR calc non Af Amer: 60 mL/min — ABNORMAL LOW (ref >60)
Glucose, Bld: 148 mg/dL — ABNORMAL HIGH (ref 70–99)
Potassium: 3.6 mmol/L (ref 3.5–5.1)
Sodium: 138 mmol/L (ref 135–145)

## 2018-10-07 LAB — TSH: TSH: 1.305 u[IU]/mL (ref 0.350–4.500)

## 2018-10-07 LAB — CBC
HCT: 42.3 % (ref 36.0–46.0)
Hemoglobin: 14.1 g/dL (ref 12.0–15.0)
MCH: 29.7 pg (ref 26.0–34.0)
MCHC: 33.3 g/dL (ref 30.0–36.0)
MCV: 89.2 fL (ref 80.0–100.0)
Platelets: 298 10*3/uL (ref 150–400)
RBC: 4.74 MIL/uL (ref 3.87–5.11)
RDW: 12.3 % (ref 11.5–15.5)
WBC: 11 10*3/uL — ABNORMAL HIGH (ref 4.0–10.5)
nRBC: 0 % (ref 0.0–0.2)

## 2018-10-07 LAB — MAGNESIUM: Magnesium: 2.1 mg/dL (ref 1.7–2.4)

## 2018-10-07 LAB — T4, FREE: Free T4: 0.8 ng/dL (ref 0.61–1.12)

## 2018-10-07 MED ORDER — ADENOSINE 12 MG/4ML IV SOLN
INTRAVENOUS | Status: AC
  Filled 2018-10-07: qty 4

## 2018-10-07 MED ORDER — ADENOSINE 6 MG/2ML IV SOLN
INTRAVENOUS | Status: AC
  Administered 2018-10-07: 12 mg
  Filled 2018-10-07: qty 2

## 2018-10-07 NOTE — ED Provider Notes (Signed)
[facility] Emergency Department Provider Note       Time seen: ----------------------------------------- 6:22 PM on 10/07/2018 -----------------------------------------   I have reviewed the triage vital signs and the nursing notes.  HISTORY   Chief Complaint Tachycardia    HPI Krystal Weaver is a 63 y.o. female with a history of depression, hyperlipidemia, bipolar disorder who presents to the ED for rapid heartbeat.  Patient states she was in the bathtub when it started, she tried a Valsalva maneuver and carotid massage as well as cold water on her face but was unable to break the heart rate.  Had one episode of this 5 to 6 years ago, is employed as a Marine scientist here.  Past Medical History:  Diagnosis Date  . Depression    Bi polar type 2    Patient Active Problem List   Diagnosis Date Noted  . Tubular adenoma of colon 08/25/2017  . Family history of alcoholism 08/18/2016  . Varicose veins of both lower extremities with pain 11/03/2015  . Chronic venous insufficiency 11/03/2015  . Hyperlipidemia 02/20/2015  . Bipolar disorder, unspecified ([place]) 08/24/2012  . Encounter for preventive health examination 08/24/2012    Past Surgical History:  Procedure Laterality Date  . BREAST CYST ASPIRATION  1983   Negative  . BREAST EXCISIONAL BIOPSY Left 1982   Negative - Excisional  . BREAST SURGERY Left 2004  . MELANOMA EXCISION      Allergies Patient has no known allergies.  Social History Social History   Tobacco Use  . Smoking status: Never Smoker  . Smokeless tobacco: Never Used  Substance Use Topics  . Alcohol use: Yes    Alcohol/week: 5.0 standard drinks    Types: 5 Glasses of wine per week  . Drug use: No   Review of Systems Constitutional: Negative for fever. Cardiovascular: Negative for chest pain.  Positive for fast heartbeat Respiratory: Negative for shortness of breath. Gastrointestinal: Negative for abdominal pain, vomiting and  diarrhea. Musculoskeletal: Negative for back pain. Skin: Negative for rash. Neurological: Negative for headaches, focal weakness or numbness.  All systems negative/normal/unremarkable except as stated in the HPI  ____________________________________________   PHYSICAL EXAM:  VITAL SIGNS: ED Triage Vitals  Enc Vitals Group     BP      Pulse      Resp      Temp      Temp src      SpO2      Weight      Height      Head Circumference      Peak Flow      Pain Score      Pain Loc      Pain Edu?      Excl. in [place]?    Constitutional: Alert and oriented. Well appearing and in no distress. Eyes: Conjunctivae are normal. Normal extraocular movements. ENT      Head: Normocephalic and atraumatic.      Nose: No congestion/rhinnorhea.      Mouth/Throat: Mucous membranes are moist.      Neck: No stridor. Cardiovascular: Normal rate, regular rhythm. No murmurs, rubs, or gallops. Respiratory: Normal respiratory effort without tachypnea nor retractions. Breath sounds are clear and equal bilaterally. No wheezes/rales/rhonchi. Gastrointestinal: Soft and nontender. Normal bowel sounds Musculoskeletal: Nontender with normal range of motion in extremities. No lower extremity tenderness nor edema. Neurologic:  Normal speech and language. No gross focal neurologic deficits are appreciated.  Skin:  Skin is warm, dry and  intact. No rash noted. Psychiatric: Mood and affect are normal. Speech and behavior are normal.  ____________________________________________  EKG: Interpreted by me.  Sinus tach/SVT with a rate of 153 bpm, ST depressions, normal axis, normal QT  Repeat EKG interpreted by me, sinus tachycardia with a rate of 100 bpm, normal PR interval, normal QRS, normal QT, normal axis  ____________________________________________  ED COURSE:  As part of my medical decision making, I reviewed the following data within the [place] History obtained from family if  available, nursing notes, old chart and ekg, as well as notes from prior ED visits. Patient presented for SVT and was given 12 mg of adenosine with conversion back to a sinus rhythm, we will assess with labs and imaging as indicated at this time.   Procedures  ROSETTER LOCH was evaluated in Emergency Department on 10/07/2018 for the symptoms described in the history of present illness. She was evaluated in the context of the global COVID-19 pandemic, which necessitated consideration that the patient might be at risk for infection with the SARS-CoV-2 virus that causes COVID-19. Institutional protocols and algorithms that pertain to the evaluation of patients at risk for COVID-19 are in a state of rapid change based on information released by regulatory bodies including the CDC and federal and state organizations. These policies and algorithms were followed during the patient's care in the ED.  ____________________________________________   LABS (pertinent positives/negatives)  Labs Reviewed  BASIC METABOLIC PANEL - Abnormal; Notable for the following components:      Result Value   Glucose, Bld 148 (*)    GFR calc non Af Amer 60 (*)    All other components within normal limits  CBC - Abnormal; Notable for the following components:   WBC 11.0 (*)    All other components within normal limits  TROPONIN I (HIGH SENSITIVITY) - Abnormal; Notable for the following components:   Troponin I (High Sensitivity) 18 (*)    All other components within normal limits  TSH  T4, FREE  MAGNESIUM    RADIOLOGY Images were viewed by me  Chest x-ray  IMPRESSION:  A transcutaneous pacer partially obscures the medial upper left  chest. No acute abnormalities otherwise seen.  ____________________________________________   DIFFERENTIAL DIAGNOSIS   SVT, rapid atrial fibrillation, electrolyte abnormality, MI, hyperthyroidism  FINAL ASSESSMENT AND PLAN  SVT   Plan: The patient had presented for SVT  and was converted using adenosine 12 mg. Patient's labs did not reveal any acute process. Patient's imaging was reassuring.  She is cleared for outpatient follow-up with cardiology.   Laurence Aly, MD    Note: This note was generated in part or whole with voice recognition software. Voice recognition is usually quite accurate but there are transcription errors that can and very often do occur. I apologize for any typographical errors that were not detected and corrected.     Earleen Newport, MD 10/07/18 [phone]

## 2018-10-07 NOTE — ED Triage Notes (Signed)
Pt to ED via POV c/o rapid heart rate. Pt states that she was in the bath tub when it started. Pt states that that she tried valsalva maneuver, carotid massage, and cold water to the face but was unable to break heart rate. Pt is in NAD at this time.

## 2018-10-07 NOTE — ED Notes (Addendum)
Code cart pads placed on pt at this time

## 2018-10-10 ENCOUNTER — Other Ambulatory Visit: Payer: Self-pay

## 2018-10-12 ENCOUNTER — Encounter: Payer: Self-pay | Admitting: Internal Medicine

## 2018-10-12 ENCOUNTER — Other Ambulatory Visit: Payer: Self-pay

## 2018-10-12 ENCOUNTER — Ambulatory Visit (INDEPENDENT_AMBULATORY_CARE_PROVIDER_SITE_OTHER): Payer: 59 | Admitting: Internal Medicine

## 2018-10-12 VITALS — BP 118/80 | HR 63 | Temp 97.5°F | Resp 14 | Ht 65.0 in | Wt 142.0 lb

## 2018-10-12 DIAGNOSIS — I471 Supraventricular tachycardia, unspecified: Secondary | ICD-10-CM

## 2018-10-12 DIAGNOSIS — Z1231 Encounter for screening mammogram for malignant neoplasm of breast: Secondary | ICD-10-CM

## 2018-10-12 DIAGNOSIS — Z0000 Encounter for general adult medical examination without abnormal findings: Secondary | ICD-10-CM

## 2018-10-12 DIAGNOSIS — F3170 Bipolar disorder, currently in remission, most recent episode unspecified: Secondary | ICD-10-CM

## 2018-10-12 DIAGNOSIS — I872 Venous insufficiency (chronic) (peripheral): Secondary | ICD-10-CM

## 2018-10-12 MED ORDER — ZOSTER VAC RECOMB ADJUVANTED 50 MCG/0.5ML IM SUSR: 0.5000 mL | Freq: Once | INTRAMUSCULAR | 1 refills | Status: AC

## 2018-10-12 NOTE — Progress Notes (Signed)
Patient ID: Krystal Weaver, female    DOB: [birth year]  Age: 63 y.o. MRN: [id number]  The patient is here for annual preventive examination and management of other chronic and acute problems.  PAP SMEAR 2018: ATROPHY,  HPV  NEGATIVE  COLONOSCOPY: 2018 REPEAT  DUE IN 2023 MAMMOGRAM SEPT 2019 NORMAL    The risk factors are reflected in the social history.  The roster of all physicians providing medical care to patient - is listed in the Snapshot section of the chart.  Activities of daily living:  The patient is 100% independent in all ADLs: dressing, toileting, feeding as well as independent mobility  Home safety : The patient has smoke detectors in the home. They wear seatbelts.  There are no firearms at home. There is no violence in the home.   There is no risks for hepatitis, STDs or HIV. There is no   history of blood transfusion. They have no travel history to infectious disease endemic areas of the world.  The patient has seen their dentist in the last six month. They have seen their eye doctor in the last year. They admit to slight hearing difficulty with regard to whispered voices and some television programs.  They have deferred audiologic testing in the last year.  They do not  have excessive sun exposure. Discussed the need for sun protection: hats, long sleeves and use of sunscreen if there is significant sun exposure.   Diet: the importance of a healthy diet is discussed. They do have a healthy diet.  The benefits of regular aerobic exercise were discussed. She walks 4 times per week ,  20 minutes.   Depression screen: there are no signs or vegative symptoms of depression- irritability, change in appetite, anhedonia, sadness/tearfullness.  Cognitive assessment: the patient manages all their financial and personal affairs and is actively engaged. They could relate day,date,year and events; recalled 2/3 objects at 3 minutes; performed clock-face test normally.  The following  portions of the patient's history were reviewed and updated as appropriate: allergies, current medications, past family history, past medical history,  past surgical history, past social history  and problem list.  Visual acuity was not assessed per patient preference since she has regular follow up with her ophthalmologist. Hearing and body mass index were assessed and reviewed.   During the course of the visit the patient was educated and counseled about appropriate screening and preventive services including : fall prevention , diabetes screening, nutrition counseling, colorectal cancer screening, and recommended immunizations.    CC: The primary encounter diagnosis was Encounter for screening mammogram for malignant neoplasm of breast. Diagnoses of Encounter for preventive health examination, Bipolar disorder in full remission, most recent episode unspecified type [facility]), Chronic venous insufficiency, and Paroxysmal SVT (supraventricular tachycardia) ([place]) were also pertinent to this visit.  1)  Recent episode of SVT requiring adenosine push via ED viist Sept 26. Last episode was 5-6 years prior . Home maneuvers tried (carotid massage,  Valsalva,  Cold water on face) .  LABS AND EKG REVIEWED WITH PATIENT WBC 11,  TSH NORML TROPOININ BORDERLINE, LYTES NORMAL . SEEING FATH TOMORROW  2) VARICELLA ZOSTER,  2ND EPISODE IN 10 YEARS  treated in ED August 1   RIGHT CHEST AND BACK  Social:  She is Therapist, sports AT [employer] .  The patient has no signs or symptoms of COVID 19 infection (fever, cough, sore throat  or shortness of breath beyond what is typical for patient).  Patient  denies contact with other persons with the above mentioned symptoms or with anyone confirmed to have COVID 19     3) Bipolar d/o: mangaed by Royston Bake, last visit April   4) Derm annual visit [name] history of melanoma in [place] has a past medical history of Depression.   She has a past surgical  history that includes Breast surgery (Left, 2004); Breast excisional biopsy (Left, 1982); Breast cyst aspiration (1983); and Melanoma excision.   Her family history includes Alcohol abuse in her mother, sister, and son; Breast cancer (age of onset: 52) in her sister; Breast cancer (age of onset: 70) in her maternal aunt; Cancer in her maternal aunt and sister; Cancer (age of onset: 104) in her father; Heart disease in her mother; Learning disabilities in her mother; Mental illness in her mother.She reports that she has never smoked. She has never used smokeless tobacco. She reports current alcohol use of about 5.0 standard drinks of alcohol per week. She reports that she does not use drugs.  Outpatient Medications Prior to Visit  Medication Sig Dispense Refill  . Cholecalciferol (VITAMIN D3) 1000 UNITS CAPS Take 1 capsule by mouth 2 (two) times daily.    . fish oil-omega-3 fatty acids 1000 MG capsule Take 1,200 g by mouth daily.    Marland Kitchen lamoTRIgine (LAMICTAL) 100 MG tablet Take 100 mg by mouth daily.    . Melatonin 5 MG CAPS Take 1 capsule by mouth daily.    . Multiple Vitamins-Minerals (MULTIVITAMIN WITH MINERALS) tablet Take 1 tablet by mouth daily.     No facility-administered medications prior to visit.     Review of Systems   Patient denies headache, fevers, malaise, unintentional weight loss, skin rash, eye pain, sinus congestion and sinus pain, sore throat, dysphagia,  hemoptysis , cough, dyspnea, wheezing, chest pain, palpitations, orthopnea, edema, abdominal pain, nausea, melena, diarrhea, constipation, flank pain, dysuria, hematuria, urinary  Frequency, nocturia, numbness, tingling, seizures,  Focal weakness, Loss of consciousness,  Tremor, insomnia, depression, anxiety, and suicidal ideation.     Objective:  BP 118/80 (BP Location: Left Arm, Patient Position: Sitting, Cuff Size: Normal)   Pulse 63   Temp (!) 97.5 F (36.4 C) (Temporal)   Resp 14   Ht 5\' 5"  (1.651 m)   Wt 142 lb  (64.4 kg)   SpO2 99%   BMI 23.63 kg/m   Physical Exam   General appearance: alert, cooperative and appears stated age Head: Normocephalic, without obvious abnormality, atraumatic Eyes: conjunctivae/corneas clear. PERRL, EOM's intact. Fundi benign. Ears: normal TM's and external ear canals both ears Nose: Nares normal. Septum midline. Mucosa normal. No drainage or sinus tenderness. Throat: lips, mucosa, and tongue normal; teeth and gums normal Neck: no adenopathy, no carotid bruit, no JVD, supple, symmetrical, trachea midline and thyroid not enlarged, symmetric, no tenderness/mass/nodules Lungs: clear to auscultation bilaterally Breasts: normal appearance, no masses or tenderness Heart: regular rate and rhythm, S1, S2 normal, no murmur, click, rub or gallop Abdomen: soft, non-tender; bowel sounds normal; no masses,  no organomegaly Extremities: extremities normal, atraumatic, no cyanosis or edema Pulses: 2+ and symmetric Skin: Skin color, texture, turgor normal. No rashes or lesions Neurologic: Alert and oriented X 3, normal strength and tone. Normal symmetric reflexes. Normal coordination and gait.     Assessment & Plan:   Problem List Items Addressed This Visit      Unprioritized   Bipolar disorder, unspecified ([place])    Stable  Managed by Dr Octavia Heir with  Lithium .  Renal and liver function normal  Lab Results  Component Value Date   CREATININE 1.00 10/07/2018   Lab Results  Component Value Date   ALT 13 08/24/2017   AST 19 08/24/2017   ALKPHOS 52 08/24/2017   BILITOT 0.6 08/24/2017         Encounter for preventive health examination    age appropriate education and counseling updated, referrals for preventative services and immunizations addressed, dietary and smoking counseling addressed, most recent labs reviewed.  I have personally reviewed and have noted:  1) the patient's medical and social history 2) The pt's use of alcohol, tobacco, and illicit drugs 3) The  patient's current medications and supplements 4) Functional ability including ADL's, fall risk, home safety risk, hearing and visual impairment 5) Diet and physical activities 6) Evidence for depression or mood disorder 7) The patient's height, weight, and BMI have been recorded in the chart  I have made referrals, and provided counseling and education based on review of the above      Chronic venous insufficiency    S/p ablation of left leg in July ,  Right leg in August (schnier)      Paroxysmal SVT (supraventricular tachycardia) ([place])    Treated with adenosine in Ed.  Sees Fath tomorrow,  Defers medication at this tie.        Other Visit Diagnoses    Encounter for screening mammogram for malignant neoplasm of breast    -  Primary   Relevant Orders   MM 3D SCREEN BREAST BILATERAL      I am having Adron Bene start on Zoster Vaccine Adjuvanted. I am also having her maintain her fish oil-omega-3 fatty acids, Vitamin D3, multivitamin with minerals, lamoTRIgine, and Melatonin.  Meds ordered this encounter  Medications  . Zoster Vaccine Adjuvanted [facility]) injection    Sig: Inject 0.5 mLs into the muscle once for 1 dose.    Dispense:  1 each    Refill:  1    There are no discontinued medications.  Follow-up: Return in about 1 year (around 10/12/2019).   Crecencio Mc, MD

## 2018-10-12 NOTE — Patient Instructions (Signed)
Your annual mammogram has been ordered.  You are encouraged (required) to call to make your appointment at [facility]   The ShingRx vaccine is now available in local pharmacies and is much more protective than the old one  Zostavax  (it is about 97%  Effective in preventing shingles). .   It is therefore ADVISED for all interested adults over 50 to prevent shingles so I have printed you a prescription for it.  (it requires a 2nd dose 2 to 6 months after the first one) .  It will cause you to have flu  like symptoms for 2 days If your pharmacy is not available to give it to you,  The [facility] pharmacy will give it to you.  They are located on the ground floor of the [place] clinic   Get the flyu vaccine first  You cannot have it on the same day as your high dose flu vaccine    Health Maintenance for Postmenopausal Women Menopause is a normal process in which your ability to get pregnant comes to an end. This process happens slowly over many months or years, usually between the ages of 45 and 3. Menopause is complete when you have missed your menstrual periods for 12 months. It is important to talk with your health care provider about some of the most common conditions that affect women after menopause (postmenopausal women). These include heart disease, cancer, and bone loss (osteoporosis). Adopting a healthy lifestyle and getting preventive care can help to promote your health and wellness. The actions you take can also lower your chances of developing some of these common conditions. What should I know about menopause? During menopause, you may get a number of symptoms, such as:  Hot flashes. These can be moderate or severe.  Night sweats.  Decrease in sex drive.  Mood swings.  Headaches.  Tiredness.  Irritability.  Memory problems.  Insomnia. Choosing to treat or not to treat these symptoms is a decision that you make with your health care  provider. Do I need hormone replacement therapy?  Hormone replacement therapy is effective in treating symptoms that are caused by menopause, such as hot flashes and night sweats.  Hormone replacement carries certain risks, especially as you become older. If you are thinking about using estrogen or estrogen with progestin, discuss the benefits and risks with your health care provider. What is my risk for heart disease and stroke? The risk of heart disease, heart attack, and stroke increases as you age. One of the causes may be a change in the body's hormones during menopause. This can affect how your body uses dietary fats, triglycerides, and cholesterol. Heart attack and stroke are medical emergencies. There are many things that you can do to help prevent heart disease and stroke. Watch your blood pressure  High blood pressure causes heart disease and increases the risk of stroke. This is more likely to develop in people who have high blood pressure readings, are of African descent, or are overweight.  Have your blood pressure checked: ? Every 3-5 years if you are 109-3 years of age. ? Every year if you are 59 years old or older. Eat a healthy diet   Eat a diet that includes plenty of vegetables, fruits, low-fat dairy products, and lean protein.  Do not eat a lot of foods that are high in solid fats, added sugars, or sodium. Get regular exercise Get regular exercise. This is one of the most important  things you can do for your health. Most adults should:  Try to exercise for at least 150 minutes each week. The exercise should increase your heart rate and make you sweat (moderate-intensity exercise).  Try to do strengthening exercises at least twice each week. Do these in addition to the moderate-intensity exercise.  Spend less time sitting. Even light physical activity can be beneficial. Other tips  Work with your health care provider to achieve or maintain a healthy weight.  Do not  use any products that contain nicotine or tobacco, such as cigarettes, e-cigarettes, and chewing tobacco. If you need help quitting, ask your health care provider.  Know your numbers. Ask your health care provider to check your cholesterol and your blood sugar (glucose). Continue to have your blood tested as directed by your health care provider. Do I need screening for cancer? Depending on your health history and family history, you may need to have cancer screening at different stages of your life. This may include screening for:  Breast cancer.  Cervical cancer.  Lung cancer.  Colorectal cancer. What is my risk for osteoporosis? After menopause, you may be at increased risk for osteoporosis. Osteoporosis is a condition in which bone destruction happens more quickly than new bone creation. To help prevent osteoporosis or the bone fractures that can happen because of osteoporosis, you may take the following actions:  If you are 51-22 years old, get at least 1,000 mg of calcium and at least 600 mg of vitamin D per day.  If you are older than age 43 but younger than age 59, get at least 1,200 mg of calcium and at least 600 mg of vitamin D per day.  If you are older than age 27, get at least 1,200 mg of calcium and at least 800 mg of vitamin D per day. Smoking and drinking excessive alcohol increase the risk of osteoporosis. Eat foods that are rich in calcium and vitamin D, and do weight-bearing exercises several times each week as directed by your health care provider. How does menopause affect my mental health? Depression may occur at any age, but it is more common as you become older. Common symptoms of depression include:  Low or sad mood.  Changes in sleep patterns.  Changes in appetite or eating patterns.  Feeling an overall lack of motivation or enjoyment of activities that you previously enjoyed.  Frequent crying spells. Talk with your health care provider if you think that  you are experiencing depression. General instructions See your health care provider for regular wellness exams and vaccines. This may include:  Scheduling regular health, dental, and eye exams.  Getting and maintaining your vaccines. These include: ? Influenza vaccine. Get this vaccine each year before the flu season begins. ? Pneumonia vaccine. ? Shingles vaccine. ? Tetanus, diphtheria, and pertussis (Tdap) booster vaccine. Your health care provider may also recommend other immunizations. Tell your health care provider if you have ever been abused or do not feel safe at home. Summary  Menopause is a normal process in which your ability to get pregnant comes to an end.  This condition causes hot flashes, night sweats, decreased interest in sex, mood swings, headaches, or lack of sleep.  Treatment for this condition may include hormone replacement therapy.  Take actions to keep yourself healthy, including exercising regularly, eating a healthy diet, watching your weight, and checking your blood pressure and blood sugar levels.  Get screened for cancer and depression. Make sure that you are up  to date with all your vaccines. This information is not intended to replace advice given to you by your health care provider. Make sure you discuss any questions you have with your health care provider. Document Released: 02/19/2005 Document Revised: 12/21/2017 Document Reviewed: 12/21/2017 Elsevier Patient Education  2020 [employer].

## 2018-10-13 DIAGNOSIS — I471 Supraventricular tachycardia: Secondary | ICD-10-CM | POA: Diagnosis not present

## 2018-10-13 DIAGNOSIS — I872 Venous insufficiency (chronic) (peripheral): Secondary | ICD-10-CM | POA: Diagnosis not present

## 2018-10-13 DIAGNOSIS — E782 Mixed hyperlipidemia: Secondary | ICD-10-CM | POA: Diagnosis not present

## 2018-10-14 DIAGNOSIS — I471 Supraventricular tachycardia, unspecified: Secondary | ICD-10-CM | POA: Insufficient documentation

## 2018-10-14 NOTE — Assessment & Plan Note (Signed)
S/p ablation of left leg in July ,  Right leg in August (schnier)

## 2018-10-14 NOTE — Assessment & Plan Note (Signed)
Stable  Managed by Dr Octavia Heir with Lithium .  Renal and liver function normal  Lab Results  Component Value Date   CREATININE 1.00 10/07/2018   Lab Results  Component Value Date   ALT 13 08/24/2017   AST 19 08/24/2017   ALKPHOS 52 08/24/2017   BILITOT 0.6 08/24/2017

## 2018-10-14 NOTE — Assessment & Plan Note (Signed)

## 2018-10-14 NOTE — Assessment & Plan Note (Signed)
Treated with adenosine in Ed.  Sees Fath tomorrow,  Defers medication at this tie.

## 2018-11-01 ENCOUNTER — Other Ambulatory Visit: Payer: Self-pay | Admitting: Internal Medicine

## 2018-11-01 ENCOUNTER — Other Ambulatory Visit: Payer: Self-pay

## 2018-11-01 ENCOUNTER — Ambulatory Visit
Admission: RE | Admit: 2018-11-01 | Discharge: 2018-11-01 | Disposition: A | Discharge status: Home / Self Care | Payer: 59 | Source: Ambulatory Visit | Attending: Internal Medicine | Admitting: Internal Medicine

## 2018-11-01 DIAGNOSIS — Z1231 Encounter for screening mammogram for malignant neoplasm of breast: Secondary | ICD-10-CM | POA: Diagnosis not present

## 2018-11-01 DIAGNOSIS — R928 Other abnormal and inconclusive findings on diagnostic imaging of breast: Secondary | ICD-10-CM

## 2018-11-06 DIAGNOSIS — F3181 Bipolar II disorder: Secondary | ICD-10-CM | POA: Diagnosis not present

## 2018-11-16 ENCOUNTER — Ambulatory Visit
Admission: RE | Admit: 2018-11-16 | Discharge: 2018-11-16 | Disposition: A | Discharge status: Home / Self Care | Payer: 59 | Source: Ambulatory Visit | Attending: Internal Medicine | Admitting: Internal Medicine

## 2018-11-16 DIAGNOSIS — R928 Other abnormal and inconclusive findings on diagnostic imaging of breast: Secondary | ICD-10-CM

## 2019-01-24 DIAGNOSIS — H524 Presbyopia: Secondary | ICD-10-CM | POA: Diagnosis not present

## 2019-02-07 DIAGNOSIS — Z86007 Personal history of in-situ neoplasm of skin: Secondary | ICD-10-CM | POA: Diagnosis not present

## 2019-02-07 DIAGNOSIS — D1800 Hemangioma unspecified site: Secondary | ICD-10-CM | POA: Diagnosis not present

## 2019-02-07 DIAGNOSIS — D225 Melanocytic nevi of trunk: Secondary | ICD-10-CM | POA: Diagnosis not present

## 2019-02-07 DIAGNOSIS — Z1283 Encounter for screening for malignant neoplasm of skin: Secondary | ICD-10-CM | POA: Diagnosis not present

## 2019-02-07 DIAGNOSIS — L814 Other melanin hyperpigmentation: Secondary | ICD-10-CM | POA: Diagnosis not present

## 2019-02-07 DIAGNOSIS — Z86018 Personal history of other benign neoplasm: Secondary | ICD-10-CM | POA: Diagnosis not present

## 2019-02-07 DIAGNOSIS — L821 Other seborrheic keratosis: Secondary | ICD-10-CM | POA: Diagnosis not present

## 2019-02-07 DIAGNOSIS — D224 Melanocytic nevi of scalp and neck: Secondary | ICD-10-CM | POA: Diagnosis not present

## 2019-04-30 DIAGNOSIS — F3181 Bipolar II disorder: Secondary | ICD-10-CM | POA: Diagnosis not present

## 2019-06-20 ENCOUNTER — Ambulatory Visit: Payer: 59 | Admitting: Dermatology

## 2019-06-20 ENCOUNTER — Other Ambulatory Visit: Payer: Self-pay

## 2019-06-20 DIAGNOSIS — D239 Other benign neoplasm of skin, unspecified: Secondary | ICD-10-CM

## 2019-06-20 DIAGNOSIS — L578 Other skin changes due to chronic exposure to nonionizing radiation: Secondary | ICD-10-CM | POA: Diagnosis not present

## 2019-06-20 DIAGNOSIS — Z1283 Encounter for screening for malignant neoplasm of skin: Secondary | ICD-10-CM | POA: Diagnosis not present

## 2019-06-20 DIAGNOSIS — L821 Other seborrheic keratosis: Secondary | ICD-10-CM

## 2019-06-20 DIAGNOSIS — D2339 Other benign neoplasm of skin of other parts of face: Secondary | ICD-10-CM | POA: Diagnosis not present

## 2019-06-20 DIAGNOSIS — Z86006 Personal history of melanoma in-situ: Secondary | ICD-10-CM | POA: Diagnosis not present

## 2019-06-20 DIAGNOSIS — D2272 Melanocytic nevi of left lower limb, including hip: Secondary | ICD-10-CM

## 2019-06-20 DIAGNOSIS — D229 Melanocytic nevi, unspecified: Secondary | ICD-10-CM

## 2019-06-20 DIAGNOSIS — D1801 Hemangioma of skin and subcutaneous tissue: Secondary | ICD-10-CM | POA: Diagnosis not present

## 2019-06-20 DIAGNOSIS — D492 Neoplasm of unspecified behavior of bone, soft tissue, and skin: Secondary | ICD-10-CM

## 2019-06-20 NOTE — Patient Instructions (Signed)

## 2019-06-20 NOTE — Progress Notes (Signed)
Follow-Up Visit   Subjective  Krystal Weaver is a 64 y.o. female who presents for the following: Annual Exam (4 months f/u TBSE ) and Skin Cancer (Hx of Melanoma in situ, R calf 2019). The patient presents for Total-Body Skin Exam (TBSE) for skin cancer screening and mole check.  The following portions of the chart were reviewed this encounter and updated as appropriate:  Tobacco  Allergies  Meds  Problems  Med Hx  Surg Hx  Fam Hx      Review of Systems:  No other skin or systemic complaints except as noted in HPI or Assessment and Plan.  Objective  Well appearing patient in no apparent distress; mood and affect are within normal limits.  A full examination was performed including scalp, head, eyes, ears, nose, lips, neck, chest, axillae, abdomen, back, buttocks, bilateral upper extremities, bilateral lower extremities, hands, feet, fingers, toes, fingernails, and toenails. All findings within normal limits unless otherwise noted below.  Objective  R lateral nose medial cheek: 0.2 cm blue macule   Images    Objective  Left proximal posterior lateral thigh: 0.5 cm irregular brown macule         Assessment & Plan    History of Melanoma in situ R calf-November 2019  - No evidence of recurrence today - No lymphadenopathy - Recommend regular full body skin exams - Recommend daily broad spectrum sunscreen SPF 30+ to sun-exposed areas, reapply every 2 hours as needed.  - Call if any new or changing lesions are noted between office visits  Blue nevus R lateral nose medial cheek  Observe   Neoplasm of skin Left proximal posterior lateral thigh  Epidermal / dermal shaving  Lesion length (cm):  0.5 Lesion width (cm):  0.5 Margin per side (cm):  0.2 Total excision diameter (cm):  0.9 Informed consent: discussed and consent obtained   Timeout: patient name, date of birth, surgical site, and procedure verified   Procedure prep:  Patient was prepped and  draped in usual sterile fashion Prep type:  Isopropyl alcohol Anesthesia: the lesion was anesthetized in a standard fashion   Anesthetic:  1% lidocaine w/ epinephrine 1-100,000 buffered w/ 8.4% NaHCO3 Hemostasis achieved with: pressure, aluminum chloride and electrodesiccation   Outcome: patient tolerated procedure well   Post-procedure details: sterile dressing applied and wound care instructions given   Dressing type: bandage and petrolatum    Specimen 1 - Surgical pathology Differential Diagnosis: Dysplastic nevus vs other  Check Margins: No 0.5 cm irregular brown macule  Skin cancer screening  Lentigines - Scattered tan macules - Discussed due to sun exposure - Benign, observe - Call for any changes  Seborrheic Keratoses - Stuck-on, waxy, tan-brown papules and plaques  - Discussed benign etiology and prognosis. - Observe - Call for any changes  Melanocytic Nevi - Tan-brown and/or pink-flesh-colored symmetric macules and papules - Benign appearing on exam today - Observation - Call clinic for new or changing moles - Recommend daily use of broad spectrum spf 30+ sunscreen to sun-exposed areas.   Hemangiomas - Red papules - Discussed benign nature - Observe - Call for any changes  Actinic Damage - diffuse scaly erythematous macules with underlying dyspigmentation - Recommend daily broad spectrum sunscreen SPF 30+ to sun-exposed areas, reapply every 2 hours as needed.  - Call for new or changing lesions.  Skin cancer screening performed today.  Return in about 4 months (around 10/20/2019) for TBSE . IMarye Round, CMA, am acting as scribe for  [name], MD .  Documentation: I have reviewed the above documentation for accuracy and completeness, and I agree with the above.  [name], MD

## 2019-06-26 ENCOUNTER — Telehealth: Payer: Self-pay

## 2019-06-26 ENCOUNTER — Encounter: Payer: Self-pay | Admitting: Dermatology

## 2019-06-26 NOTE — Telephone Encounter (Signed)
-----   Message from Ralene Bathe, MD sent at 06/25/2019  6:08 PM EDT ----- [place] TO SEVERE ATYPIA  Dysplastic Moderate to severe Schedule surgery

## 2019-06-26 NOTE — Telephone Encounter (Signed)
error 

## 2019-06-26 NOTE — Telephone Encounter (Signed)
Discussed biopsy results with pt  °

## 2019-06-26 NOTE — Telephone Encounter (Signed)
-----   Message from Ralene Bathe, MD sent at 06/25/2019  6:08 PM EDT ----- [place] TO SEVERE ATYPIA  Dysplastic Moderate to severe Schedule surgery

## 2019-06-26 NOTE — Telephone Encounter (Signed)
Left message on voicemail to return my call.  

## 2019-07-31 ENCOUNTER — Ambulatory Visit (INDEPENDENT_AMBULATORY_CARE_PROVIDER_SITE_OTHER): Payer: 59 | Admitting: Dermatology

## 2019-07-31 ENCOUNTER — Other Ambulatory Visit: Payer: Self-pay

## 2019-07-31 ENCOUNTER — Telehealth: Payer: Self-pay

## 2019-07-31 DIAGNOSIS — L988 Other specified disorders of the skin and subcutaneous tissue: Secondary | ICD-10-CM | POA: Diagnosis not present

## 2019-07-31 DIAGNOSIS — D2372 Other benign neoplasm of skin of left lower limb, including hip: Secondary | ICD-10-CM | POA: Diagnosis not present

## 2019-07-31 DIAGNOSIS — D239 Other benign neoplasm of skin, unspecified: Secondary | ICD-10-CM

## 2019-07-31 MED ORDER — MUPIROCIN 2 % EX OINT: 1.0000 "application " | TOPICAL_OINTMENT | Freq: Every day | CUTANEOUS | 0 refills | Status: DC

## 2019-07-31 NOTE — Progress Notes (Signed)
   Follow-Up Visit   Subjective  Krystal Weaver is a 64 y.o. female who presents for the following: dysplastic nevus (patient is here today for excision of a severely dysplastic nevus of the L prox post lat thigh).  The following portions of the chart were reviewed this encounter and updated as appropriate:  Tobacco  Allergies  Meds  Problems  Med Hx  Surg Hx  Fam Hx     Review of Systems:  No other skin or systemic complaints except as noted in HPI or Assessment and Plan.  Objective  Well appearing patient in no apparent distress; mood and affect are within normal limits.  A focused examination was performed including the legs. Relevant physical exam findings are noted in the Assessment and Plan.    Assessment & Plan    Dysplastic nevus L prox post lat thigh  Skin excision - L prox post lat thigh  Lesion length (cm):  1.1 Lesion width (cm):  0.8 Margin per side (cm):  0.2 Total excision diameter (cm):  1.5 Informed consent: discussed and consent obtained   Timeout: patient name, date of birth, surgical site, and procedure verified   Procedure prep:  Patient was prepped and draped in usual sterile fashion Prep type:  Isopropyl alcohol and povidone-iodine Anesthesia: the lesion was anesthetized in a standard fashion   Anesthetic:  1% lidocaine w/ epinephrine 1-100,000 buffered w/ 8.4% NaHCO3 (16cc) Instrument used: #15 blade   Hemostasis achieved with: pressure   Hemostasis achieved with comment:  Electrocautery Outcome: patient tolerated procedure well with no complications   Post-procedure details: sterile dressing applied and wound care instructions given   Dressing type: bandage and pressure dressing    Skin repair - L prox post lat thigh Complexity:  Complex Final length (cm):  4 Informed consent: discussed and consent obtained   Timeout: patient name, date of birth, surgical site, and procedure verified   Procedure prep:  Patient was prepped and draped in  usual sterile fashion Prep type:  Povidone-iodine Anesthesia: the lesion was anesthetized in a standard fashion   Reason for type of repair: reduce tension to allow closure, reduce the risk of dehiscence, infection, and necrosis, reduce subcutaneous dead space and avoid a hematoma, allow closure of the large defect, preserve normal anatomy, preserve normal anatomical and functional relationships and enhance both functionality and cosmetic results   Undermining: area extensively undermined   Undermining comment:  1.6 cm Subcutaneous layers (deep stitches):  Suture size:  2-0 Suture type: Vicryl (polyglactin 910)   Stitches:  Buried horizontal mattress Fine/surface layer approximation (top stitches):  Suture size:  3-0 Suture type: nylon   Stitches: simple running   Suture removal (days):  7 Hemostasis achieved with: suture and pressure Outcome: patient tolerated procedure well with no complications   Post-procedure details: sterile dressing applied and wound care instructions given   Dressing type: bandage and pressure dressing    mupirocin ointment (BACTROBAN) 2 % - L prox post lat thigh  Specimen 1 - Surgical pathology Differential Diagnosis: Severe dysplastic nevus Check Margins: Yes [id number]  Return in about 1 week (around 08/07/2019) for suture removal.  I, Rudell Cobb, CMA, am acting as scribe for [name], MD .  Documentation: I have reviewed the above documentation for accuracy and completeness, and I agree with the above.  [name], MD

## 2019-07-31 NOTE — Patient Instructions (Signed)

## 2019-07-31 NOTE — Telephone Encounter (Signed)
Spoke with patient regarding surgery. She is doing fine. 

## 2019-08-02 ENCOUNTER — Encounter: Payer: Self-pay | Admitting: Dermatology

## 2019-08-07 ENCOUNTER — Ambulatory Visit (INDEPENDENT_AMBULATORY_CARE_PROVIDER_SITE_OTHER): Payer: 59 | Admitting: Dermatology

## 2019-08-07 ENCOUNTER — Other Ambulatory Visit: Payer: Self-pay

## 2019-08-07 DIAGNOSIS — Z4802 Encounter for removal of sutures: Secondary | ICD-10-CM

## 2019-08-07 NOTE — Progress Notes (Signed)
   Follow-Up Visit   Subjective  Krystal Weaver is a 64 y.o. female who presents for the following: Follow-up (suture removal from excision of a severe dysplastic nevus on Left prox lat thigh).  Patient presents today for follow up on OV 07/31/19 from excision of a severe dysplastic nevus on Left prox lat thigh for suture removal  The following portions of the chart were reviewed this encounter and updated as appropriate:  Tobacco  Allergies  Meds  Problems  Med Hx  Surg Hx  Fam Hx     Review of Systems:  No other skin or systemic complaints except as noted in HPI or Assessment and Plan.  Objective  Well appearing patient in no apparent distress; mood and affect are within normal limits.  A focused examination was performed including Left proximal lateral thigh. Relevant physical exam findings are noted in the Assessment and Plan.  Objective  Left Proximal Lateral Thigh: Excision site is clean, dry and intact    Assessment & Plan    Encounter for removal of sutures Left Proximal Lateral Thigh  Returning suture removal from  EXCISION, SCATTERED ATYPICAL MELANOCYTES, DIAGNOSTIC NEVUS NOT SEEN, MARGINS FREE, - Wound cleansed, sutures removed, wound cleansed and steri strips applied.  - Discussed pathology results showing inflamed cyst - Patient advised to keep steri-strips dry until they fall off. - Scars remodel for a full year. - Once steri-strips fall off, patient can apply over-the-counter silicone scar cream each night to help with scar remodeling if desired. - Patient advised to call with any concerns or if they notice any new or changing lesions.    Return for As Scheduled on Oct. 6.  I, Donzetta Kohut, CMA, am acting as scribe for [name], MD . Documentation: I have reviewed the above documentation for accuracy and completeness, and I agree with the above.  [name], MD

## 2019-08-07 NOTE — Patient Instructions (Signed)
Recommend daily broad spectrum sunscreen SPF 30+ to sun-exposed areas, reapply every 2 hours as needed. Call for new or changing lesions.  

## 2019-08-10 ENCOUNTER — Encounter: Payer: Self-pay | Admitting: Dermatology

## 2019-10-17 ENCOUNTER — Other Ambulatory Visit: Payer: Self-pay

## 2019-10-17 ENCOUNTER — Ambulatory Visit (INDEPENDENT_AMBULATORY_CARE_PROVIDER_SITE_OTHER): Payer: 59 | Admitting: Dermatology

## 2019-10-17 DIAGNOSIS — Z86006 Personal history of melanoma in-situ: Secondary | ICD-10-CM | POA: Diagnosis not present

## 2019-10-17 DIAGNOSIS — D1800 Hemangioma unspecified site: Secondary | ICD-10-CM | POA: Diagnosis not present

## 2019-10-17 DIAGNOSIS — L814 Other melanin hyperpigmentation: Secondary | ICD-10-CM

## 2019-10-17 DIAGNOSIS — Z86018 Personal history of other benign neoplasm: Secondary | ICD-10-CM

## 2019-10-17 DIAGNOSIS — Z1283 Encounter for screening for malignant neoplasm of skin: Secondary | ICD-10-CM | POA: Diagnosis not present

## 2019-10-17 DIAGNOSIS — L821 Other seborrheic keratosis: Secondary | ICD-10-CM

## 2019-10-17 DIAGNOSIS — D229 Melanocytic nevi, unspecified: Secondary | ICD-10-CM

## 2019-10-17 DIAGNOSIS — L578 Other skin changes due to chronic exposure to nonionizing radiation: Secondary | ICD-10-CM

## 2019-10-17 DIAGNOSIS — D239 Other benign neoplasm of skin, unspecified: Secondary | ICD-10-CM

## 2019-10-17 DIAGNOSIS — L304 Erythema intertrigo: Secondary | ICD-10-CM

## 2019-10-17 DIAGNOSIS — D2339 Other benign neoplasm of skin of other parts of face: Secondary | ICD-10-CM | POA: Diagnosis not present

## 2019-10-17 NOTE — Patient Instructions (Signed)
Instructions for [facility] Medications  One or more of your medications was sent to the [facility] mail order compounding pharmacy. You will receive an email from them and can purchase the medicine through that link. It will then be mailed to your home at the address you confirmed. If for any reason you do not receive an email from them, please check your spam folder. If you still do not find the email, please let us know. [facility] phone number is [phone].   

## 2019-10-17 NOTE — Progress Notes (Signed)
   Follow-Up Visit   Subjective  Krystal Weaver is a 64 y.o. female who presents for the following: Annual Exam (History of Melanoma and dysplastic nevi - TBSE today). The patient presents for Total-Body Skin Exam (TBSE) for skin cancer screening and mole check. She has had irritation between her toes and would like to discuss treatment. The spot on her nose has not changed.  We will review previous records with photos.  The following portions of the chart were reviewed this encounter and updated as appropriate:  Tobacco  Allergies  Meds  Problems  Med Hx  Surg Hx  Fam Hx     Review of Systems:  No other skin or systemic complaints except as noted in HPI or Assessment and Plan.  Objective  Well appearing patient in no apparent distress; mood and affect are within normal limits.  A full examination was performed including scalp, head, eyes, ears, nose, lips, neck, chest, axillae, abdomen, back, buttocks, bilateral upper extremities, bilateral lower extremities, hands, feet, fingers, toes, fingernails, and toenails. All findings within normal limits unless otherwise noted below.  Objective  Right lat nose medial cheek: 0.2 cm blue macule  Objective  Right 4th-5th Toe Web: Maceration  Objective  Right calf: Well healed excision site. No lymphadenopathy.   Assessment & Plan    Lentigines - Scattered tan macules - Discussed due to sun exposure - Benign, observe - Call for any changes  Seborrheic Keratoses - Stuck-on, waxy, tan-brown papules and plaques  - Discussed benign etiology and prognosis. - Observe - Call for any changes  Melanocytic Nevi - Tan-brown and/or pink-flesh-colored symmetric macules and papules - Benign appearing on exam today - Observation - Call clinic for new or changing moles - Recommend daily use of broad spectrum spf 30+ sunscreen to sun-exposed areas.   Hemangiomas - Red papules - Discussed benign nature - Observe - Call for any  changes  Actinic Damage - diffuse scaly erythematous macules with underlying dyspigmentation - Recommend daily broad spectrum sunscreen SPF 30+ to sun-exposed areas, reapply every 2 hours as needed.  - Call for new or changing lesions.  Skin cancer screening performed today.  History of Dysplastic Nevi - No evidence of recurrence today - Recommend regular full body skin exams - Recommend daily broad spectrum sunscreen SPF 30+ to sun-exposed areas, reapply every 2 hours as needed.  - Call if any new or changing lesions are noted between office visits  Blue nevus Right lat nose medial cheek Benign-appearing.  Observation.  Call clinic for new or changing lesions.  Recommend daily use of broad spectrum spf 30+ sunscreen to sun-exposed areas.   No change from previous photo taken.  Photos are reviewed in records.  Erythema intertrigo Right 4th-5th Toe Web Start Iodoquinol 1%/Hydrocortisone 2.5%/Niacinamide 2% cream bid prn - sent to [facility]  History of melanoma in situ Right calf Clear.  No lymphadenopathy.  observe for recurrence. Call clinic for new or changing lesions.  Recommend regular skin exams, daily broad-spectrum spf 30+ sunscreen use, and photoprotection.    Return in about 4 months (around 02/17/2020) for TBSE.   I, Ashok Cordia, CMA, am acting as scribe for [name], MD .  Documentation: I have reviewed the above documentation for accuracy and completeness, and I agree with the above.  [name], MD

## 2019-10-18 ENCOUNTER — Encounter: Payer: Self-pay | Admitting: Dermatology

## 2019-10-29 ENCOUNTER — Other Ambulatory Visit: Payer: Self-pay | Admitting: Internal Medicine

## 2019-10-29 DIAGNOSIS — Z1231 Encounter for screening mammogram for malignant neoplasm of breast: Secondary | ICD-10-CM

## 2019-10-29 DIAGNOSIS — F3181 Bipolar II disorder: Secondary | ICD-10-CM | POA: Diagnosis not present

## 2019-11-05 ENCOUNTER — Ambulatory Visit
Admission: RE | Admit: 2019-11-05 | Discharge: 2019-11-05 | Disposition: A | Discharge status: Home / Self Care | Payer: 59 | Source: Ambulatory Visit | Attending: Internal Medicine | Admitting: Internal Medicine

## 2019-11-05 ENCOUNTER — Other Ambulatory Visit: Payer: Self-pay

## 2019-11-05 DIAGNOSIS — Z1231 Encounter for screening mammogram for malignant neoplasm of breast: Secondary | ICD-10-CM | POA: Diagnosis not present

## 2019-11-21 ENCOUNTER — Other Ambulatory Visit: Payer: Self-pay

## 2019-11-21 ENCOUNTER — Other Ambulatory Visit (HOSPITAL_COMMUNITY)
Admission: RE | Admit: 2019-11-21 | Discharge: 2019-11-21 | Disposition: A | Discharge status: Home / Self Care | Payer: Medicare HMO | Source: Ambulatory Visit | Attending: Internal Medicine | Admitting: Internal Medicine

## 2019-11-21 ENCOUNTER — Ambulatory Visit (INDEPENDENT_AMBULATORY_CARE_PROVIDER_SITE_OTHER): Payer: Medicare HMO | Admitting: Internal Medicine

## 2019-11-21 ENCOUNTER — Encounter: Payer: Self-pay | Admitting: Internal Medicine

## 2019-11-21 VITALS — BP 106/66 | HR 62 | Temp 97.9°F | Resp 13 | Ht 65.0 in | Wt 147.2 lb

## 2019-11-21 DIAGNOSIS — Z23 Encounter for immunization: Secondary | ICD-10-CM | POA: Diagnosis not present

## 2019-11-21 DIAGNOSIS — C439 Malignant melanoma of skin, unspecified: Secondary | ICD-10-CM | POA: Diagnosis not present

## 2019-11-21 DIAGNOSIS — I471 Supraventricular tachycardia: Secondary | ICD-10-CM

## 2019-11-21 DIAGNOSIS — D126 Benign neoplasm of colon, unspecified: Secondary | ICD-10-CM

## 2019-11-21 DIAGNOSIS — F3170 Bipolar disorder, currently in remission, most recent episode unspecified: Secondary | ICD-10-CM

## 2019-11-21 DIAGNOSIS — Z0000 Encounter for general adult medical examination without abnormal findings: Secondary | ICD-10-CM | POA: Diagnosis not present

## 2019-11-21 DIAGNOSIS — I872 Venous insufficiency (chronic) (peripheral): Secondary | ICD-10-CM | POA: Diagnosis not present

## 2019-11-21 DIAGNOSIS — Z85820 Personal history of malignant melanoma of skin: Secondary | ICD-10-CM | POA: Insufficient documentation

## 2019-11-21 DIAGNOSIS — Z124 Encounter for screening for malignant neoplasm of cervix: Secondary | ICD-10-CM | POA: Insufficient documentation

## 2019-11-21 DIAGNOSIS — Z1151 Encounter for screening for human papillomavirus (HPV): Secondary | ICD-10-CM | POA: Diagnosis not present

## 2019-11-21 DIAGNOSIS — E782 Mixed hyperlipidemia: Secondary | ICD-10-CM | POA: Diagnosis not present

## 2019-11-21 DIAGNOSIS — R69 Illness, unspecified: Secondary | ICD-10-CM | POA: Diagnosis not present

## 2019-11-21 NOTE — Assessment & Plan Note (Signed)
Managed with use of compression stockings

## 2019-11-21 NOTE — Assessment & Plan Note (Signed)
Found on 2018 colonoscopy, with FH colon CA.  Follow up 2023.

## 2019-11-21 NOTE — Patient Instructions (Signed)

## 2019-11-21 NOTE — Assessment & Plan Note (Addendum)
Her mood has been stable with daily use of lamotrigine. °

## 2019-11-21 NOTE — Progress Notes (Signed)
Patient ID: Krystal Weaver, female    DOB: 17-Aug-1955  Age: 64 y.o. MRN: [id number]  The patient is here for annual  wellness examination and management of other chronic and acute problems.  This visit occurred during the SARS-CoV-2 public health emergency.  Safety protocols were in place, including screening questions prior to the visit, additional usage of staff PPE, and extensive cleaning of exam room while observing appropriate contact time as indicated for disinfecting solutions.    Patient has received both doses of the available COVID 19 vaccine without complications.  Patient continues to mask when outside of the home except when walking in yard or at safe distances from others .  Patient denies any change in mood or development of unhealthy behaviors resuting from the pandemic's restriction of activities and socialization.      The risk factors are reflected in the social history.  The roster of all physicians providing medical care to patient - is listed in the Snapshot section of the chart.  Activities of daily living:  The patient is 100% independent in all ADLs: dressing, toileting, feeding as well as independent mobility  Home safety : The patient has smoke detectors in the home. They wear seatbelts.  There are no firearms at home. There is no violence in the home.   There is no risks for hepatitis, STDs or HIV. There is no   history of blood transfusion. They have no travel history to infectious disease endemic areas of the world.  The patient has seen their dentist in the last six month. They have seen their eye doctor in the last year. They admit to slight hearing difficulty with regard to whispered voices and some television programs.  They have deferred audiologic testing in the last year.  They do not  have excessive sun exposure. Discussed the need for sun protection: hats, long sleeves and use of sunscreen if there is significant sun exposure.   Diet: the importance of a  healthy diet is discussed. They do have a healthy diet.  The benefits of regular aerobic exercise were discussed. She walks 4 times per week ,  20 minutes.   Depression screen: there are no signs or vegative symptoms of depression- irritability, change in appetite, anhedonia, sadness/tearfullness.  Cognitive assessment: the patient manages all their financial and personal affairs and is actively engaged. They could relate day,date,year and events; recalled 2/3 objects at 3 minutes; performed clock-face test normally.  The following portions of the patient's history were reviewed and updated as appropriate: allergies, current medications, past family history, past medical history,  past surgical history, past social history  and problem list.  Visual acuity was not assessed per patient preference since she has regular follow up with her ophthalmologist. Hearing and body mass index were assessed and reviewed.   During the course of the visit the patient was educated and counseled about appropriate screening and preventive services including : fall prevention , diabetes screening, nutrition counseling, colorectal cancer screening, and recommended immunizations.    CC: The primary encounter diagnosis was Cervical cancer screening. Diagnoses of Melanoma of skin ([place]), Mixed hyperlipidemia, Paroxysmal SVT (supraventricular tachycardia) ([place]), Tubular adenoma of colon, Bipolar disorder in full remission, most recent episode unspecified type [facility]), Encounter for preventive health examination, Chronic venous insufficiency, and Need for Tdap vaccination were also pertinent to this visit.  History Krystal Weaver has a past medical history of Bipolar 2 disorder ([place]), Cancer ([place]) (11/2016), Depression, Dysplastic nevus (06/28/2018), Dysplastic nevus (06/20/2019), and Melanoma (  [place]) (11/29/2017).   She has a past surgical history that includes Breast surgery (Left, 2004); Melanoma excision; Breast excisional biopsy  (Left, 1982); and Breast cyst aspiration (1443).   Her family history includes Alcohol abuse in her mother, sister, and son; Breast cancer (age of onset: 77) in her sister; Breast cancer (age of onset: 53) in her maternal aunt; Cancer in her maternal aunt and sister; Cancer (age of onset: 87) in her father; Heart disease in her mother; Learning disabilities in her mother; Melanoma in her brother; Mental illness in her mother; Psoriasis in her son.She reports that she has never smoked. She has never used smokeless tobacco. She reports current alcohol use of about 5.0 standard drinks of alcohol per week. She reports that she does not use drugs.  Outpatient Medications Prior to Visit  Medication Sig Dispense Refill  . Cholecalciferol (VITAMIN D3) 1000 UNITS CAPS Take 1 capsule by mouth 2 (two) times daily.    . fish oil-omega-3 fatty acids 1000 MG capsule Take 1,200 g by mouth daily.    Marland Kitchen lamoTRIgine (LAMICTAL) 100 MG tablet Take 100 mg by mouth daily.    . Melatonin 5 MG CAPS Take 1 capsule by mouth daily.    . Multiple Vitamins-Minerals (MULTIVITAMIN WITH MINERALS) tablet Take 1 tablet by mouth daily.    . mupirocin ointment (BACTROBAN) 2 % Apply 1 application topically daily. With dressing changes (Patient not taking: Reported on 11/21/2019) 22 g 0   No facility-administered medications prior to visit.    Review of Systems   Patient denies headache, fevers, malaise, unintentional weight loss, skin rash, eye pain, sinus congestion and sinus pain, sore throat, dysphagia,  hemoptysis , cough, dyspnea, wheezing, chest pain, palpitations, orthopnea, edema, abdominal pain, nausea, melena, diarrhea, constipation, flank pain, dysuria, hematuria, urinary  Frequency, nocturia, numbness, tingling, seizures,  Focal weakness, Loss of consciousness,  Tremor, insomnia, depression, anxiety, and suicidal ideation.      Objective:  BP 106/66 (BP Location: Left Arm, Patient Position: Sitting, Cuff Size: Normal)    Pulse 62   Temp 97.9 F (36.6 C) (Oral)   Resp 13   Ht 5\' 5"  (1.651 m)   Wt 147 lb 3.2 oz (66.8 kg)   SpO2 98%   BMI 24.50 kg/m   Physical Exam  General Appearance:    Alert, cooperative, no distress, appears stated age  Head:    Normocephalic, without obvious abnormality, atraumatic  Eyes:    PERRL, conjunctiva/corneas clear, EOM's intact, fundi    benign, both eyes  Ears:    Normal TM's and external ear canals, both ears  Nose:   Nares normal, septum midline, mucosa normal, no drainage    or sinus tenderness  Throat:   Lips, mucosa, and tongue normal; teeth and gums normal  Neck:   Supple, symmetrical, trachea midline, no adenopathy;    thyroid:  no enlargement/tenderness/nodules; no carotid   bruit or JVD  Back:     Symmetric, no curvature, ROM normal, no CVA tenderness  Lungs:     Clear to auscultation bilaterally, respirations unlabored  Chest Wall:    No tenderness or deformity   Heart:    Regular rate and rhythm, S1 and S2 normal, no murmur, rub   or gallop  Breast Exam:    No tenderness, masses, or nipple abnormality  Abdomen:     Soft, non-tender, bowel sounds active all four quadrants,    no masses, no organomegaly  Genitalia:    Pelvic: cervix normal in  appearance, external genitalia normal, no adnexal masses or tenderness, no cervical motion tenderness, rectovaginal septum normal, uterus normal size, shape, and consistency and vagina normal without discharge  Extremities:   Extremities normal, atraumatic, no cyanosis or edema  Pulses:   2+ and symmetric all extremities  Skin:   Skin color, texture, turgor normal, no rashes or lesions  Lymph nodes:   Cervical, supraclavicular, and axillary nodes normal  Neurologic:   CNII-XII intact, normal strength, sensation and reflexes    throughout     Assessment & Plan:   Problem List Items Addressed This Visit      Unprioritized   Bipolar disorder, unspecified ([place])    Her mood has been stable with daily use of  lamotrigine.      Chronic venous insufficiency    Managed with use of compression stockings      Encounter for preventive health examination    age appropriate education and counseling updated, referrals for preventative services and immunizations addressed, dietary and smoking counseling addressed, most recent labs reviewed.  I have personally reviewed and have noted:  1) the patient's medical and social history 2) The pt's use of alcohol, tobacco, and illicit drugs 3) The patient's current medications and supplements 4) Functional ability including ADL's, fall risk, home safety risk, hearing and visual impairment 5) Diet and physical activities 6) Evidence for depression or mood disorder 7) The patient's height, weight, and BMI have been recorded in the chart  I have made referrals, and provided counseling and education based on review of the above      Hyperlipidemia    Based on current lipid profile, the risk of clinically significant Coronary artery disease is 3.4 % over the next 10 years, using the AHA risk calculator.  No medication needed   Lab Results  Component Value Date   CHOL 249 (H) 11/21/2019   HDL 73.20 11/21/2019   LDLCALC 146 (H) 11/21/2019   TRIG 149.0 11/21/2019   CHOLHDL 3 11/21/2019   2      Relevant Orders   Lipid panel (Completed)   Melanoma of skin ([place])    She has had no recurrence since her excision Nov 2019 and is now on 6 month surveillance.       Relevant Orders   VITAMIN D 25 Hydroxy (Vit-D Deficiency, Fractures) (Completed)   Paroxysmal SVT (supraventricular tachycardia) ([facility])   Relevant Orders   Comprehensive metabolic panel (Completed)   TSH (Completed)   Tubular adenoma of colon    Found on 2018 colonoscopy, with FH colon CA.  Follow up 2023.        Other Visit Diagnoses    Cervical cancer screening    -  Primary   Relevant Orders   Cytology - PAP( [place])   Need for Tdap vaccination       Relevant Orders   Tdap  vaccine greater than or equal to 7yo IM (Completed)      I have discontinued Ramonita F. Boshers's mupirocin ointment. I am also having her maintain her fish oil-omega-3 fatty acids, Vitamin D3, multivitamin with minerals, lamoTRIgine, and Melatonin.  No orders of the defined types were placed in this encounter.   Medications Discontinued During This Encounter  Medication Reason  . mupirocin ointment (BACTROBAN) 2 %     Follow-up: No follow-ups on file.   Crecencio Mc, MD

## 2019-11-21 NOTE — Assessment & Plan Note (Signed)

## 2019-11-22 LAB — LIPID PANEL
Cholesterol: 249 mg/dL — ABNORMAL HIGH (ref 0–200)
HDL: 73.2 mg/dL (ref >39.00)
LDL Cholesterol: 146 mg/dL — ABNORMAL HIGH (ref 0–99)
NonHDL: 175.38
Total CHOL/HDL Ratio: 3
Triglycerides: 149 mg/dL (ref 0.0–149.0)
VLDL: 29.8 mg/dL (ref 0.0–40.0)

## 2019-11-22 LAB — COMPREHENSIVE METABOLIC PANEL
ALT: 12 U/L (ref 0–35)
AST: 18 U/L (ref 0–37)
Albumin: 4.7 g/dL (ref 3.5–5.2)
Alkaline Phosphatase: 58 U/L (ref 39–117)
BUN: 12 mg/dL (ref 6–23)
CO2: 30 mEq/L (ref 19–32)
Calcium: 9.2 mg/dL (ref 8.4–10.5)
Chloride: 101 mEq/L (ref 96–112)
Creatinine, Ser: 0.9 mg/dL (ref 0.40–1.20)
GFR: 67.33 mL/min (ref >60.00)
Glucose, Bld: 71 mg/dL (ref 70–99)
Potassium: 3.9 mEq/L (ref 3.5–5.1)
Sodium: 139 mEq/L (ref 135–145)
Total Bilirubin: 0.5 mg/dL (ref 0.2–1.2)
Total Protein: 6.5 g/dL (ref 6.0–8.3)

## 2019-11-22 LAB — TSH: TSH: 0.99 u[IU]/mL (ref 0.35–4.50)

## 2019-11-22 LAB — VITAMIN D 25 HYDROXY (VIT D DEFICIENCY, FRACTURES): VITD: 43.45 ng/mL (ref 30.00–100.00)

## 2019-11-22 NOTE — Assessment & Plan Note (Signed)
She has had no recurrence since her excision Nov 2019 and is now on 6 month surveillance.  

## 2019-11-22 NOTE — Assessment & Plan Note (Addendum)
Based on current lipid profile, the risk of clinically significant Coronary artery disease is 3.4 % over the next 10 years, using the AHA risk calculator.  No medication needed   Lab Results  Component Value Date   CHOL 249 (H) 11/21/2019   HDL 73.20 11/21/2019   LDLCALC 146 (H) 11/21/2019   TRIG 149.0 11/21/2019   CHOLHDL 3 11/21/2019   2

## 2019-11-23 LAB — CYTOLOGY - PAP
Comment: NEGATIVE
Diagnosis: NEGATIVE
High risk HPV: NEGATIVE

## 2019-11-23 NOTE — Progress Notes (Signed)
Your  PAP smear was HPV negative but the endocervical zone was not sampled due to "atrophy" (occurs after menopause). I recommend repeating the PAP in one year.  Regards,   Deborra Medina, MD

## 2019-11-23 NOTE — Progress Notes (Signed)
Your vitamin D, thyroid , cholesterol,  liver and kidney function are normal.  You do not need any medication changes. Please continue your current medications and plan to repeat the labs in 6 months.    Regards,   Dr. Derrel Nip

## 2019-12-19 DIAGNOSIS — Z8249 Family history of ischemic heart disease and other diseases of the circulatory system: Secondary | ICD-10-CM | POA: Diagnosis not present

## 2019-12-19 DIAGNOSIS — I471 Supraventricular tachycardia: Secondary | ICD-10-CM | POA: Diagnosis not present

## 2019-12-19 DIAGNOSIS — Z85820 Personal history of malignant melanoma of skin: Secondary | ICD-10-CM | POA: Diagnosis not present

## 2019-12-19 DIAGNOSIS — I499 Cardiac arrhythmia, unspecified: Secondary | ICD-10-CM | POA: Diagnosis not present

## 2019-12-19 DIAGNOSIS — Z803 Family history of malignant neoplasm of breast: Secondary | ICD-10-CM | POA: Diagnosis not present

## 2019-12-19 DIAGNOSIS — R030 Elevated blood-pressure reading, without diagnosis of hypertension: Secondary | ICD-10-CM | POA: Diagnosis not present

## 2019-12-19 DIAGNOSIS — R69 Illness, unspecified: Secondary | ICD-10-CM | POA: Diagnosis not present

## 2019-12-19 DIAGNOSIS — Z87891 Personal history of nicotine dependence: Secondary | ICD-10-CM | POA: Diagnosis not present

## 2020-02-13 DIAGNOSIS — M9904 Segmental and somatic dysfunction of sacral region: Secondary | ICD-10-CM | POA: Diagnosis not present

## 2020-02-13 DIAGNOSIS — M542 Cervicalgia: Secondary | ICD-10-CM | POA: Diagnosis not present

## 2020-02-13 DIAGNOSIS — M9901 Segmental and somatic dysfunction of cervical region: Secondary | ICD-10-CM | POA: Diagnosis not present

## 2020-02-13 DIAGNOSIS — M5451 Vertebrogenic low back pain: Secondary | ICD-10-CM | POA: Diagnosis not present

## 2020-02-13 DIAGNOSIS — M9903 Segmental and somatic dysfunction of lumbar region: Secondary | ICD-10-CM | POA: Diagnosis not present

## 2020-02-13 DIAGNOSIS — M7918 Myalgia, other site: Secondary | ICD-10-CM | POA: Diagnosis not present

## 2020-02-20 ENCOUNTER — Other Ambulatory Visit: Payer: Self-pay

## 2020-02-20 ENCOUNTER — Ambulatory Visit: Payer: Medicare HMO | Admitting: Dermatology

## 2020-02-20 DIAGNOSIS — Z1283 Encounter for screening for malignant neoplasm of skin: Secondary | ICD-10-CM | POA: Diagnosis not present

## 2020-02-20 DIAGNOSIS — L578 Other skin changes due to chronic exposure to nonionizing radiation: Secondary | ICD-10-CM

## 2020-02-20 DIAGNOSIS — Z86006 Personal history of melanoma in-situ: Secondary | ICD-10-CM

## 2020-02-20 DIAGNOSIS — D239 Other benign neoplasm of skin, unspecified: Secondary | ICD-10-CM

## 2020-02-20 DIAGNOSIS — L304 Erythema intertrigo: Secondary | ICD-10-CM

## 2020-02-20 DIAGNOSIS — Z86018 Personal history of other benign neoplasm: Secondary | ICD-10-CM

## 2020-02-20 DIAGNOSIS — D1800 Hemangioma unspecified site: Secondary | ICD-10-CM

## 2020-02-20 DIAGNOSIS — D229 Melanocytic nevi, unspecified: Secondary | ICD-10-CM

## 2020-02-20 DIAGNOSIS — L814 Other melanin hyperpigmentation: Secondary | ICD-10-CM | POA: Diagnosis not present

## 2020-02-20 DIAGNOSIS — L821 Other seborrheic keratosis: Secondary | ICD-10-CM | POA: Diagnosis not present

## 2020-02-20 DIAGNOSIS — D2339 Other benign neoplasm of skin of other parts of face: Secondary | ICD-10-CM

## 2020-02-20 NOTE — Progress Notes (Signed)
   Follow-Up Visit   Subjective  Krystal Weaver is a 65 y.o. female who presents for the following: Annual Exam (History of dysplastic nevi - TBSE today). The patient presents for Total-Body Skin Exam (TBSE) for skin cancer screening and mole check.  The following portions of the chart were reviewed this encounter and updated as appropriate:   Tobacco  Allergies  Meds  Problems  Med Hx  Surg Hx  Fam Hx     Review of Systems:  No other skin or systemic complaints except as noted in HPI or Assessment and Plan.  Objective  Well appearing patient in no apparent distress; mood and affect are within normal limits.  A full examination was performed including scalp, head, eyes, ears, nose, lips, neck, chest, axillae, abdomen, back, buttocks, bilateral upper extremities, bilateral lower extremities, hands, feet, fingers, toes, fingernails, and toenails. All findings within normal limits unless otherwise noted below.  Objective  Right lat nose/medial cheek: 0.2 cm blue macule  Objective  Right calf: Well healed excision site   Assessment & Plan    Lentigines - Scattered tan macules - Discussed due to sun exposure - Benign, observe - Call for any changes  Seborrheic Keratoses - Stuck-on, waxy, tan-brown papules and plaques  - Discussed benign etiology and prognosis. - Observe - Call for any changes  Melanocytic Nevi - Tan-brown and/or pink-flesh-colored symmetric macules and papules - Benign appearing on exam today - Observation - Call clinic for new or changing moles - Recommend daily use of broad spectrum spf 30+ sunscreen to sun-exposed areas.   Hemangiomas - Red papules - Discussed benign nature - Observe - Call for any changes  Actinic Damage - Chronic, secondary to cumulative UV/sun exposure - diffuse scaly erythematous macules with underlying dyspigmentation - Recommend daily broad spectrum sunscreen SPF 30+ to sun-exposed areas, reapply every 2 hours as  needed.  - Call for new or changing lesions.  Skin cancer screening performed today.  History of Dysplastic Nevi - No evidence of recurrence today - Recommend regular full body skin exams - Recommend daily broad spectrum sunscreen SPF 30+ to sun-exposed areas, reapply every 2 hours as needed.  - Call if any new or changing lesions are noted between office visits  Blue nevus Right lat nose/medial cheek No change for many years Benign-appearing.  Observation.  Call clinic for new or changing lesions.  Recommend daily use of broad spectrum spf 30+ sunscreen to sun-exposed areas.   History of melanoma in situ Right calf Clear. Observe for recurrence.  No lymphadenopathy.  Call clinic for new or changing lesions.  Recommend regular skin exams, daily broad-spectrum spf 30+ sunscreen use, and photoprotection.    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  Not to goal Continue skin medicinals intertrigo cream which has helped Recommend silicone toe separator. Discussed evaluation with podiatrist if not improving.  Intertrigo is a chronic recurrent rash that occurs in skin fold areas that may be associated with friction; heat; moisture; yeast; fungus; and bacteria.  It is exacerbated by increased movement / activity; sweating; and higher atmospheric temperature.  Return in about 6 months (around 08/19/2020) for TBSE.   I, Ashok Cordia, CMA, am acting as scribe for [name], MD .  Documentation: I have reviewed the above documentation for accuracy and completeness, and I agree with the above.  [name], MD

## 2020-02-23 ENCOUNTER — Encounter: Payer: Self-pay | Admitting: Dermatology

## 2020-03-17 DIAGNOSIS — H524 Presbyopia: Secondary | ICD-10-CM | POA: Diagnosis not present

## 2020-05-05 DIAGNOSIS — R69 Illness, unspecified: Secondary | ICD-10-CM | POA: Diagnosis not present

## 2020-07-16 DIAGNOSIS — Z0100 Encounter for examination of eyes and vision without abnormal findings: Secondary | ICD-10-CM | POA: Diagnosis not present

## 2020-08-20 ENCOUNTER — Ambulatory Visit (INDEPENDENT_AMBULATORY_CARE_PROVIDER_SITE_OTHER): Payer: Medicare HMO | Admitting: Dermatology

## 2020-08-20 ENCOUNTER — Other Ambulatory Visit: Payer: Self-pay

## 2020-08-20 DIAGNOSIS — D1800 Hemangioma unspecified site: Secondary | ICD-10-CM | POA: Diagnosis not present

## 2020-08-20 DIAGNOSIS — Z85820 Personal history of malignant melanoma of skin: Secondary | ICD-10-CM

## 2020-08-20 DIAGNOSIS — Z86006 Personal history of melanoma in-situ: Secondary | ICD-10-CM

## 2020-08-20 DIAGNOSIS — L814 Other melanin hyperpigmentation: Secondary | ICD-10-CM

## 2020-08-20 DIAGNOSIS — L578 Other skin changes due to chronic exposure to nonionizing radiation: Secondary | ICD-10-CM

## 2020-08-20 DIAGNOSIS — Z86018 Personal history of other benign neoplasm: Secondary | ICD-10-CM

## 2020-08-20 DIAGNOSIS — D229 Melanocytic nevi, unspecified: Secondary | ICD-10-CM

## 2020-08-20 DIAGNOSIS — L821 Other seborrheic keratosis: Secondary | ICD-10-CM

## 2020-08-20 DIAGNOSIS — Z1283 Encounter for screening for malignant neoplasm of skin: Secondary | ICD-10-CM | POA: Diagnosis not present

## 2020-08-20 NOTE — Patient Instructions (Signed)

## 2020-08-20 NOTE — Progress Notes (Signed)
   Follow-Up Visit   Subjective  Krystal Weaver is a 65 y.o. female who presents for the following: Annual Exam (Hx MM, dysplastic nevi ). The patient presents for Total-Body Skin Exam (TBSE) for skin cancer screening and mole check.  The following portions of the chart were reviewed this encounter and updated as appropriate:   Tobacco  Allergies  Meds  Problems  Med Hx  Surg Hx  Fam Hx     Review of Systems:  No other skin or systemic complaints except as noted in HPI or Assessment and Plan.  Objective  Well appearing patient in no apparent distress; mood and affect are within normal limits.  A full examination was performed including scalp, head, eyes, ears, nose, lips, neck, chest, axillae, abdomen, back, buttocks, bilateral upper extremities, bilateral lower extremities, hands, feet, fingers, toes, fingernails, and toenails. All findings within normal limits unless otherwise noted below.   Assessment & Plan  Personal history of malignant melanoma of skin R calf In situ -  Clear. Observe for recurrence. Call clinic for new or changing lesions.  Recommend regular skin exams, daily broad-spectrum spf 30+ sunscreen use, and photoprotection.    Skin cancer screening  Lentigines - Scattered tan macules - Due to sun exposure - Benign-appering, observe - Recommend daily broad spectrum sunscreen SPF 30+ to sun-exposed areas, reapply every 2 hours as needed. - Call for any changes  Seborrheic Keratoses - Stuck-on, waxy, tan-brown papules and/or plaques  - Benign-appearing - Discussed benign etiology and prognosis. - Observe - Call for any changes  Melanocytic Nevi - Tan-brown and/or pink-flesh-colored symmetric macules and papules - Benign appearing on exam today - Observation - Call clinic for new or changing moles - Recommend daily use of broad spectrum spf 30+ sunscreen to sun-exposed areas.   Hemangiomas - Red papules - Discussed benign nature - Observe - Call  for any changes  Actinic Damage - Chronic condition, secondary to cumulative UV/sun exposure - diffuse scaly erythematous macules with underlying dyspigmentation - Recommend daily broad spectrum sunscreen SPF 30+ to sun-exposed areas, reapply every 2 hours as needed.  - Staying in the shade or wearing long sleeves, sun glasses (UVA+UVB protection) and wide brim hats (4-inch brim around the entire circumference of the hat) are also recommended for sun protection.  - Call for new or changing lesions.  History of Melanoma in situ - No evidence of recurrence today - No lymphadenopathy - Recommend regular full body skin exams - Recommend daily broad spectrum sunscreen SPF 30+ to sun-exposed areas, reapply every 2 hours as needed.  - Call if any new or changing lesions are noted between office visits  History of Dysplastic Nevi - No evidence of recurrence today - Recommend regular full body skin exams - Recommend daily broad spectrum sunscreen SPF 30+ to sun-exposed areas, reapply every 2 hours as needed.  - Call if any new or changing lesions are noted between office visits  Skin cancer screening performed today.  Return in about 6 months (around 02/20/2021) for TBSE.  Luther Redo, CMA, am acting as scribe for [name], MD . Documentation: I have reviewed the above documentation for accuracy and completeness, and I agree with the above.  [name], MD

## 2020-08-26 ENCOUNTER — Encounter: Payer: Self-pay | Admitting: Dermatology

## 2020-10-23 ENCOUNTER — Other Ambulatory Visit: Payer: Self-pay | Admitting: Internal Medicine

## 2020-10-23 DIAGNOSIS — Z1231 Encounter for screening mammogram for malignant neoplasm of breast: Secondary | ICD-10-CM

## 2020-11-26 ENCOUNTER — Encounter: Payer: Medicare HMO | Admitting: Internal Medicine

## 2020-12-01 ENCOUNTER — Other Ambulatory Visit: Payer: Self-pay

## 2020-12-01 ENCOUNTER — Ambulatory Visit
Admission: RE | Admit: 2020-12-01 | Discharge: 2020-12-01 | Disposition: A | Discharge status: Home / Self Care | Payer: Medicare HMO | Source: Ambulatory Visit | Attending: Internal Medicine | Admitting: Internal Medicine

## 2020-12-01 DIAGNOSIS — Z1231 Encounter for screening mammogram for malignant neoplasm of breast: Secondary | ICD-10-CM | POA: Insufficient documentation

## 2020-12-01 DIAGNOSIS — F3181 Bipolar II disorder: Secondary | ICD-10-CM | POA: Diagnosis not present

## 2020-12-01 DIAGNOSIS — R69 Illness, unspecified: Secondary | ICD-10-CM | POA: Diagnosis not present

## 2020-12-03 ENCOUNTER — Encounter: Payer: Medicare HMO | Admitting: Internal Medicine

## 2020-12-08 DIAGNOSIS — M25562 Pain in left knee: Secondary | ICD-10-CM | POA: Diagnosis not present

## 2020-12-08 DIAGNOSIS — M1712 Unilateral primary osteoarthritis, left knee: Secondary | ICD-10-CM | POA: Diagnosis not present

## 2020-12-24 ENCOUNTER — Encounter: Payer: Medicare HMO | Admitting: Internal Medicine

## 2020-12-24 DIAGNOSIS — M7918 Myalgia, other site: Secondary | ICD-10-CM | POA: Diagnosis not present

## 2020-12-24 DIAGNOSIS — M9901 Segmental and somatic dysfunction of cervical region: Secondary | ICD-10-CM | POA: Diagnosis not present

## 2020-12-24 DIAGNOSIS — M5451 Vertebrogenic low back pain: Secondary | ICD-10-CM | POA: Diagnosis not present

## 2020-12-24 DIAGNOSIS — M9904 Segmental and somatic dysfunction of sacral region: Secondary | ICD-10-CM | POA: Diagnosis not present

## 2020-12-24 DIAGNOSIS — M542 Cervicalgia: Secondary | ICD-10-CM | POA: Diagnosis not present

## 2020-12-24 DIAGNOSIS — M9903 Segmental and somatic dysfunction of lumbar region: Secondary | ICD-10-CM | POA: Diagnosis not present

## 2021-01-07 ENCOUNTER — Ambulatory Visit (INDEPENDENT_AMBULATORY_CARE_PROVIDER_SITE_OTHER): Payer: Medicare HMO | Admitting: Internal Medicine

## 2021-01-07 ENCOUNTER — Encounter: Payer: Self-pay | Admitting: Internal Medicine

## 2021-01-07 ENCOUNTER — Other Ambulatory Visit: Payer: Self-pay

## 2021-01-07 ENCOUNTER — Other Ambulatory Visit (HOSPITAL_COMMUNITY)
Admission: RE | Admit: 2021-01-07 | Discharge: 2021-01-07 | Disposition: A | Discharge status: Home / Self Care | Payer: Medicare HMO | Source: Ambulatory Visit | Attending: Internal Medicine | Admitting: Internal Medicine

## 2021-01-07 VITALS — BP 136/72 | HR 72 | Temp 98.3°F | Ht 65.0 in | Wt 143.8 lb

## 2021-01-07 DIAGNOSIS — R5383 Other fatigue: Secondary | ICD-10-CM

## 2021-01-07 DIAGNOSIS — Z01419 Encounter for gynecological examination (general) (routine) without abnormal findings: Secondary | ICD-10-CM | POA: Insufficient documentation

## 2021-01-07 DIAGNOSIS — Z780 Asymptomatic menopausal state: Secondary | ICD-10-CM

## 2021-01-07 DIAGNOSIS — Z1151 Encounter for screening for human papillomavirus (HPV): Secondary | ICD-10-CM | POA: Insufficient documentation

## 2021-01-07 DIAGNOSIS — R69 Illness, unspecified: Secondary | ICD-10-CM | POA: Diagnosis not present

## 2021-01-07 DIAGNOSIS — R7301 Impaired fasting glucose: Secondary | ICD-10-CM | POA: Diagnosis not present

## 2021-01-07 DIAGNOSIS — F3170 Bipolar disorder, currently in remission, most recent episode unspecified: Secondary | ICD-10-CM

## 2021-01-07 DIAGNOSIS — Z1211 Encounter for screening for malignant neoplasm of colon: Secondary | ICD-10-CM | POA: Diagnosis not present

## 2021-01-07 DIAGNOSIS — Z0000 Encounter for general adult medical examination without abnormal findings: Secondary | ICD-10-CM | POA: Diagnosis not present

## 2021-01-07 DIAGNOSIS — Z124 Encounter for screening for malignant neoplasm of cervix: Secondary | ICD-10-CM

## 2021-01-07 DIAGNOSIS — E782 Mixed hyperlipidemia: Secondary | ICD-10-CM

## 2021-01-07 NOTE — Assessment & Plan Note (Signed)
Her mood has been stable with daily use of lamotrigine.

## 2021-01-07 NOTE — Progress Notes (Signed)
Patient ID: Krystal Weaver, female    DOB: 1955-08-13  Age: 65 y.o. MRN: [id number]  The patient is here for annual preventive examination  This visit occurred during the SARS-CoV-2 public health emergency.  Safety protocols were in place, including screening questions prior to the visit, additional usage of staff PPE, and extensive cleaning of exam room while observing appropriate contact time as indicated for disinfecting solutions.  .   The risk factors are reflected in the social history.  The roster of all physicians providing medical care to patient - is listed in the Snapshot section of the chart.  Activities of daily living:  The patient is 100% independent in all ADLs: dressing, toileting, feeding as well as independent mobility  Home safety : The patient has smoke detectors in the home. They wear seatbelts.  There are no firearms at home. There is no violence in the home.   There is no risks for hepatitis, STDs or HIV. There is no   history of blood transfusion. They have no travel history to infectious disease endemic areas of the world.  The patient has seen their dentist in the last six month. They have seen their eye doctor in the last year. They admit to slight hearing difficulty with regard to whispered voices and some television programs.  They have deferred audiologic testing in the last year.  They do not  have excessive sun exposure. Discussed the need for sun protection: hats, long sleeves and use of sunscreen if there is significant sun exposure.   Diet: the importance of a healthy diet is discussed. They do have a healthy diet.  The benefits of regular aerobic exercise were discussed. She plays pickle ball 4 to 5 times per week .   Depression screen: there are no signs or vegative symptoms of depression- irritability, change in appetite, anhedonia, sadness/tearfullness.  Cognitive assessment: the patient manages all their financial and personal affairs and is actively  engaged. They could relate day,date,year and events; recalled 2/3 objects at 3 minutes; performed clock-face test normally.  The following portions of the patient's history were reviewed and updated as appropriate: allergies, current medications, past family history, past medical history,  past surgical history, past social history  and problem list.  Visual acuity was not assessed per patient preference since she has regular follow up with her ophthalmologist. Hearing and body mass index were assessed and reviewed.   During the course of the visit the patient was educated and counseled about appropriate screening and preventive services including : fall prevention , diabetes screening, nutrition counseling, colorectal cancer screening, and recommended immunizations.    CC: The primary encounter diagnosis was Encounter for preventive health examination. Diagnoses of Mixed hyperlipidemia, Cervical cancer screening, Other fatigue, Impaired fasting glucose, Postmenopausal estrogen deficiency, Colon cancer screening, and Bipolar disorder in full remission, most recent episode unspecified type [facility]) were also pertinent to this visit.  History Krystal Weaver has a past medical history of Bipolar 2 disorder ([place]), Cancer ([place]) (11/2016), Depression, Dysplastic nevus (06/28/2018), Dysplastic nevus (06/20/2019), and Melanoma ([place]) (11/29/2017).   She has a past surgical history that includes Breast surgery (Left, 2004); Melanoma excision; Breast excisional biopsy (Left, 1982); and Breast cyst aspiration (4268).   Her family history includes Alcohol abuse in her mother, sister, and son; Breast cancer (age of onset: 3) in her sister; Breast cancer (age of onset: 52) in her maternal aunt; Cancer in her maternal aunt and sister; Cancer (age of onset: 60) in her father; Heart  disease in her mother; Learning disabilities in her mother; Melanoma in her brother; Mental illness in her mother; Psoriasis in her son.She reports  that she has never smoked. She has never used smokeless tobacco. She reports current alcohol use of about 5.0 standard drinks per week. She reports that she does not use drugs.  Outpatient Medications Prior to Visit  Medication Sig Dispense Refill   ascorbic acid (VITAMIN C) 500 MG tablet Take by mouth.     Cholecalciferol (VITAMIN D3) 1000 UNITS CAPS Take 1 capsule by mouth 2 (two) times daily.     fish oil-omega-3 fatty acids 1000 MG capsule Take 1,200 g by mouth daily.     lamoTRIgine (LAMICTAL) 100 MG tablet Take 100 mg by mouth daily.     Magnesium 400 MG TABS Take by mouth.     Multiple Vitamins-Minerals (MULTIVITAMIN WITH MINERALS) tablet Take 1 tablet by mouth daily.     Melatonin 5 MG CAPS Take 1 capsule by mouth daily. (Patient not taking: Reported on 01/07/2021)     No facility-administered medications prior to visit.    Review of Systems  Patient denies headache, fevers, malaise, unintentional weight loss, skin rash, eye pain, sinus congestion and sinus pain, sore throat, dysphagia,  hemoptysis , cough, dyspnea, wheezing, chest pain, palpitations, orthopnea, edema, abdominal pain, nausea, melena, diarrhea, constipation, flank pain, dysuria, hematuria, urinary  Frequency, nocturia, numbness, tingling, seizures,  Focal weakness, Loss of consciousness,  Tremor, insomnia, depression, anxiety, and suicidal ideation.     Objective:  BP 136/72 (BP Location: Left Arm, Patient Position: Sitting, Cuff Size: Normal)    Pulse 72    Temp 98.3 F (36.8 C) (Oral)    Ht '5\' 5"'  (1.651 m)    Wt 143 lb 12.8 oz (65.2 kg)    SpO2 98%    BMI 23.93 kg/m   Physical Exam  General Appearance:    Alert, cooperative, no distress, appears stated age  Head:    Normocephalic, without obvious abnormality, atraumatic  Eyes:    PERRL, conjunctiva/corneas clear, EOM's intact, fundi    benign, both eyes  Ears:    Normal TM's and external ear canals, both ears  Nose:   Nares normal, septum midline, mucosa  normal, no drainage    or sinus tenderness  Throat:   Lips, mucosa, and tongue normal; teeth and gums normal  Neck:   Supple, symmetrical, trachea midline, no adenopathy;    thyroid:  no enlargement/tenderness/nodules; no carotid   bruit or JVD  Back:     Symmetric, no curvature, ROM normal, no CVA tenderness  Lungs:     Clear to auscultation bilaterally, respirations unlabored  Chest Wall:    No tenderness or deformity   Heart:    Regular rate and rhythm, S1 and S2 normal, no murmur, rub   or gallop  Breast Exam:    No tenderness, masses, or nipple abnormality  Abdomen:     Soft, non-tender, bowel sounds active all four quadrants,    no masses, no organomegaly  Genitalia:    Pelvic: cervix normal in appearance, external genitalia normal, no adnexal masses or tenderness, no cervical motion tenderness, rectovaginal septum normal, uterus normal size, shape, and consistency and vagina normal with green opalescent discharge  Extremities:   Extremities normal, atraumatic, no cyanosis or edema  Pulses:   2+ and symmetric all extremities  Skin:   Skin color, texture, turgor normal, no rashes or lesions  Lymph nodes:   Cervical, supraclavicular, and axillary nodes  normal  Neurologic:   CNII-XII intact, normal strength, sensation and reflexes    throughout     Assessment & Plan:   Problem List Items Addressed This Visit     Bipolar disorder, unspecified ([place])    Her mood has been stable with daily use of lamotrigine.      Encounter for preventive health examination - Primary    age appropriate education and counseling updated, referrals for preventative services and immunizations addressed, dietary and smoking counseling addressed, most recent labs reviewed.  I have personally reviewed and have noted:   1) the patient's medical and social history 2) The pt's use of alcohol, tobacco, and illicit drugs 3) The patient's current medications and supplements 4) Functional ability including  ADL's, fall risk, home safety risk, hearing and visual impairment 5) Diet and physical activities 6) Evidence for depression or mood disorder 7) The patient's height, weight, and BMI have been recorded in the chart   I have made referrals, and provided counseling and education based on review of the above      Hyperlipidemia   Relevant Orders   Lipid Profile   Other Visit Diagnoses     Cervical cancer screening       Relevant Orders   Cytology - PAP( [place])   Other fatigue       Relevant Orders   TSH   CBC with Differential/Platelet   Impaired fasting glucose       Relevant Orders   Comp Met (CMET)   HgB A1c   Postmenopausal estrogen deficiency       Relevant Orders   DG Bone Density   Colon cancer screening       Relevant Orders   Ambulatory referral to Gastroenterology       I have discontinued Jaysha F. Peddie's Melatonin. I am also having her maintain her fish oil-omega-3 fatty acids, Vitamin D3, multivitamin with minerals, lamoTRIgine, Magnesium, and ascorbic acid.  No orders of the defined types were placed in this encounter.   Medications Discontinued During This Encounter  Medication Reason   Melatonin 5 MG CAPS     Follow-up: No follow-ups on file.   Crecencio Mc, MD

## 2021-01-07 NOTE — Patient Instructions (Signed)
Your bone density test  been ordered.  Please call to make your appointment at [facility]    Your next colonoscopy is due in March 2023  so I have made a  referral to Dr Haig Prophet

## 2021-01-07 NOTE — Assessment & Plan Note (Signed)

## 2021-01-09 ENCOUNTER — Other Ambulatory Visit (INDEPENDENT_AMBULATORY_CARE_PROVIDER_SITE_OTHER): Payer: Medicare HMO

## 2021-01-09 ENCOUNTER — Other Ambulatory Visit: Payer: Self-pay

## 2021-01-09 DIAGNOSIS — R5383 Other fatigue: Secondary | ICD-10-CM | POA: Diagnosis not present

## 2021-01-09 DIAGNOSIS — E782 Mixed hyperlipidemia: Secondary | ICD-10-CM | POA: Diagnosis not present

## 2021-01-09 DIAGNOSIS — R7301 Impaired fasting glucose: Secondary | ICD-10-CM

## 2021-01-09 LAB — CBC WITH DIFFERENTIAL/PLATELET
Basophils Absolute: 0 10*3/uL (ref 0.0–0.1)
Basophils Relative: 1.1 % (ref 0.0–3.0)
Eosinophils Absolute: 0.1 10*3/uL (ref 0.0–0.7)
Eosinophils Relative: 2.4 % (ref 0.0–5.0)
HCT: 41.6 % (ref 36.0–46.0)
Hemoglobin: 13.4 g/dL (ref 12.0–15.0)
Lymphocytes Relative: 40.7 % (ref 12.0–46.0)
Lymphs Abs: 1.8 10*3/uL (ref 0.7–4.0)
MCHC: 32.1 g/dL (ref 30.0–36.0)
MCV: 88.6 fl (ref 78.0–100.0)
Monocytes Absolute: 0.4 10*3/uL (ref 0.1–1.0)
Monocytes Relative: 9.6 % (ref 3.0–12.0)
Neutro Abs: 2 10*3/uL (ref 1.4–7.7)
Neutrophils Relative %: 46.2 % (ref 43.0–77.0)
Platelets: 288 10*3/uL (ref 150.0–400.0)
RBC: 4.69 Mil/uL (ref 3.87–5.11)
RDW: 14.1 % (ref 11.5–15.5)
WBC: 4.3 10*3/uL (ref 4.0–10.5)

## 2021-01-09 LAB — COMPREHENSIVE METABOLIC PANEL
ALT: 14 U/L (ref 0–35)
AST: 18 U/L (ref 0–37)
Albumin: 4.5 g/dL (ref 3.5–5.2)
Alkaline Phosphatase: 49 U/L (ref 39–117)
BUN: 14 mg/dL (ref 6–23)
CO2: 31 mEq/L (ref 19–32)
Calcium: 9.5 mg/dL (ref 8.4–10.5)
Chloride: 102 mEq/L (ref 96–112)
Creatinine, Ser: 0.87 mg/dL (ref 0.40–1.20)
GFR: 69.57 mL/min (ref >60.00)
Glucose, Bld: 97 mg/dL (ref 70–99)
Potassium: 4.2 mEq/L (ref 3.5–5.1)
Sodium: 139 mEq/L (ref 135–145)
Total Bilirubin: 0.9 mg/dL (ref 0.2–1.2)
Total Protein: 6.4 g/dL (ref 6.0–8.3)

## 2021-01-09 LAB — LIPID PANEL
Cholesterol: 263 mg/dL — ABNORMAL HIGH (ref 0–200)
HDL: 77.6 mg/dL (ref >39.00)
LDL Cholesterol: 165 mg/dL — ABNORMAL HIGH (ref 0–99)
NonHDL: 184.9
Total CHOL/HDL Ratio: 3
Triglycerides: 101 mg/dL (ref 0.0–149.0)
VLDL: 20.2 mg/dL (ref 0.0–40.0)

## 2021-01-09 LAB — HEMOGLOBIN A1C: Hgb A1c MFr Bld: 5.8 % (ref 4.6–6.5)

## 2021-01-09 LAB — TSH: TSH: 0.88 u[IU]/mL (ref 0.35–5.50)

## 2021-01-14 LAB — CYTOLOGY - PAP
Comment: NEGATIVE
High risk HPV: NEGATIVE

## 2021-01-15 ENCOUNTER — Encounter: Payer: Self-pay | Admitting: Internal Medicine

## 2021-02-23 DIAGNOSIS — Z86010 Personal history of colonic polyps: Secondary | ICD-10-CM | POA: Diagnosis not present

## 2021-02-25 ENCOUNTER — Ambulatory Visit: Payer: Medicare HMO | Admitting: Dermatology

## 2021-02-25 ENCOUNTER — Other Ambulatory Visit: Payer: Self-pay

## 2021-02-25 DIAGNOSIS — Z86018 Personal history of other benign neoplasm: Secondary | ICD-10-CM | POA: Diagnosis not present

## 2021-02-25 DIAGNOSIS — L814 Other melanin hyperpigmentation: Secondary | ICD-10-CM | POA: Diagnosis not present

## 2021-02-25 DIAGNOSIS — Z1283 Encounter for screening for malignant neoplasm of skin: Secondary | ICD-10-CM | POA: Diagnosis not present

## 2021-02-25 DIAGNOSIS — D1800 Hemangioma unspecified site: Secondary | ICD-10-CM

## 2021-02-25 DIAGNOSIS — D229 Melanocytic nevi, unspecified: Secondary | ICD-10-CM | POA: Diagnosis not present

## 2021-02-25 DIAGNOSIS — L578 Other skin changes due to chronic exposure to nonionizing radiation: Secondary | ICD-10-CM | POA: Diagnosis not present

## 2021-02-25 DIAGNOSIS — L821 Other seborrheic keratosis: Secondary | ICD-10-CM | POA: Diagnosis not present

## 2021-02-25 DIAGNOSIS — Z86006 Personal history of melanoma in-situ: Secondary | ICD-10-CM | POA: Diagnosis not present

## 2021-02-25 NOTE — Progress Notes (Signed)
° °  Follow-Up Visit   Subjective  Krystal Weaver is a 66 y.o. female who presents for the following: Annual Exam (History of Melanoma and dysplastic nevi - TBSE today). The patient presents for Total-Body Skin Exam (TBSE) for skin cancer screening and mole check.  The patient has spots, moles and lesions to be evaluated, some may be new or changing and the patient has concerns that these could be cancer.  The following portions of the chart were reviewed this encounter and updated as appropriate:   Tobacco   Allergies   Meds   Problems   Med Hx   Surg Hx   Fam Hx      Review of Systems:  No other skin or systemic complaints except as noted in HPI or Assessment and Plan.  Objective  Well appearing patient in no apparent distress; mood and affect are within normal limits.  A full examination was performed including scalp, head, eyes, ears, nose, lips, neck, chest, axillae, abdomen, back, buttocks, bilateral upper extremities, bilateral lower extremities, hands, feet, fingers, toes, fingernails, and toenails. All findings within normal limits unless otherwise noted below.  Right calf Well healed excision site   Assessment & Plan   History of Dysplastic Nevi - No evidence of recurrence today - Recommend regular full body skin exams - Recommend daily broad spectrum sunscreen SPF 30+ to sun-exposed areas, reapply every 2 hours as needed.  - Call if any new or changing lesions are noted between office visits  Lentigines - Scattered tan macules - Due to sun exposure - Benign-appearing, observe - Recommend daily broad spectrum sunscreen SPF 30+ to sun-exposed areas, reapply every 2 hours as needed. - Call for any changes  Seborrheic Keratoses - Stuck-on, waxy, tan-brown papules and/or plaques  - Benign-appearing - Discussed benign etiology and prognosis. - Observe - Call for any changes  Melanocytic Nevi - Tan-brown and/or pink-flesh-colored symmetric macules and papules - Benign  appearing on exam today - Observation - Call clinic for new or changing moles - Recommend daily use of broad spectrum spf 30+ sunscreen to sun-exposed areas.   Hemangiomas - Red papules - Discussed benign nature - Observe - Call for any changes  Actinic Damage - Chronic condition, secondary to cumulative UV/sun exposure - diffuse scaly erythematous macules with underlying dyspigmentation - Recommend daily broad spectrum sunscreen SPF 30+ to sun-exposed areas, reapply every 2 hours as needed.  - Staying in the shade or wearing long sleeves, sun glasses (UVA+UVB protection) and wide brim hats (4-inch brim around the entire circumference of the hat) are also recommended for sun protection.  - Call for new or changing lesions.  Skin cancer screening performed today.  History of melanoma in situ Right calf  Clear. Observe for recurrence. Call clinic for new or changing lesions.  Recommend regular skin exams, daily broad-spectrum spf 30+ sunscreen use, and photoprotection.    History of Melanoma in Situ - No evidence of recurrence today - Recommend regular full body skin exams - Recommend daily broad spectrum sunscreen SPF 30+ to sun-exposed areas, reapply every 2 hours as needed.  - Call if any new or changing lesions are noted between office visits   Skin cancer screening   Return in about 6 months (around 08/25/2021) for TBSE.  I, Ashok Cordia, CMA, am acting as scribe for [name], MD . Documentation: I have reviewed the above documentation for accuracy and completeness, and I agree with the above.  [name], MD

## 2021-02-25 NOTE — Patient Instructions (Signed)

## 2021-02-28 ENCOUNTER — Encounter: Payer: Self-pay | Admitting: Dermatology

## 2021-03-26 DIAGNOSIS — Z86010 Personal history of colonic polyps: Secondary | ICD-10-CM | POA: Diagnosis not present

## 2021-03-26 DIAGNOSIS — K640 First degree hemorrhoids: Secondary | ICD-10-CM | POA: Diagnosis not present

## 2021-03-26 HISTORY — PX: COLONOSCOPY: SHX174

## 2021-03-26 LAB — HM COLONOSCOPY

## 2021-04-22 ENCOUNTER — Telehealth: Payer: Self-pay | Admitting: Internal Medicine

## 2021-04-22 NOTE — Telephone Encounter (Signed)
Copied from [place] [phone]. Topic: Medicare AWV ?>> Apr 22, 2021  9:57 AM Harris-Coley, Hannah Beat wrote: ?Reason for CRM: Left message for patient to schedule Annual Wellness Visit.  Please schedule with Nurse Health Advisor Denisa O'Brien-Blaney, LPN at [facility].  Please call [phone] ask for Juliann Pulse ?

## 2021-06-04 ENCOUNTER — Telehealth: Payer: Self-pay

## 2021-06-04 NOTE — Telephone Encounter (Signed)
LMTCB. Pt is due for a 6 month follow up for chronic care management. Please schedule pt when she returns call.

## 2021-07-01 DIAGNOSIS — M542 Cervicalgia: Secondary | ICD-10-CM | POA: Diagnosis not present

## 2021-07-01 DIAGNOSIS — M9901 Segmental and somatic dysfunction of cervical region: Secondary | ICD-10-CM | POA: Diagnosis not present

## 2021-07-01 DIAGNOSIS — M7918 Myalgia, other site: Secondary | ICD-10-CM | POA: Diagnosis not present

## 2021-07-01 DIAGNOSIS — M5451 Vertebrogenic low back pain: Secondary | ICD-10-CM | POA: Diagnosis not present

## 2021-07-01 DIAGNOSIS — M9903 Segmental and somatic dysfunction of lumbar region: Secondary | ICD-10-CM | POA: Diagnosis not present

## 2021-07-01 DIAGNOSIS — M9904 Segmental and somatic dysfunction of sacral region: Secondary | ICD-10-CM | POA: Diagnosis not present

## 2021-08-19 DIAGNOSIS — M5451 Vertebrogenic low back pain: Secondary | ICD-10-CM | POA: Diagnosis not present

## 2021-08-19 DIAGNOSIS — M9901 Segmental and somatic dysfunction of cervical region: Secondary | ICD-10-CM | POA: Diagnosis not present

## 2021-08-19 DIAGNOSIS — M9904 Segmental and somatic dysfunction of sacral region: Secondary | ICD-10-CM | POA: Diagnosis not present

## 2021-08-19 DIAGNOSIS — M9903 Segmental and somatic dysfunction of lumbar region: Secondary | ICD-10-CM | POA: Diagnosis not present

## 2021-08-19 DIAGNOSIS — M542 Cervicalgia: Secondary | ICD-10-CM | POA: Diagnosis not present

## 2021-08-19 DIAGNOSIS — M7918 Myalgia, other site: Secondary | ICD-10-CM | POA: Diagnosis not present

## 2021-08-27 ENCOUNTER — Ambulatory Visit: Payer: Medicare HMO | Admitting: Dermatology

## 2021-09-01 IMAGING — DX DG CHEST 1V PORT
1 series · 1 of 1 positions shown · non-contrast
Comparison: None

CLINICAL DATA: Tachycardia

EXAM:
PORTABLE CHEST 1 VIEW

[chest ap]
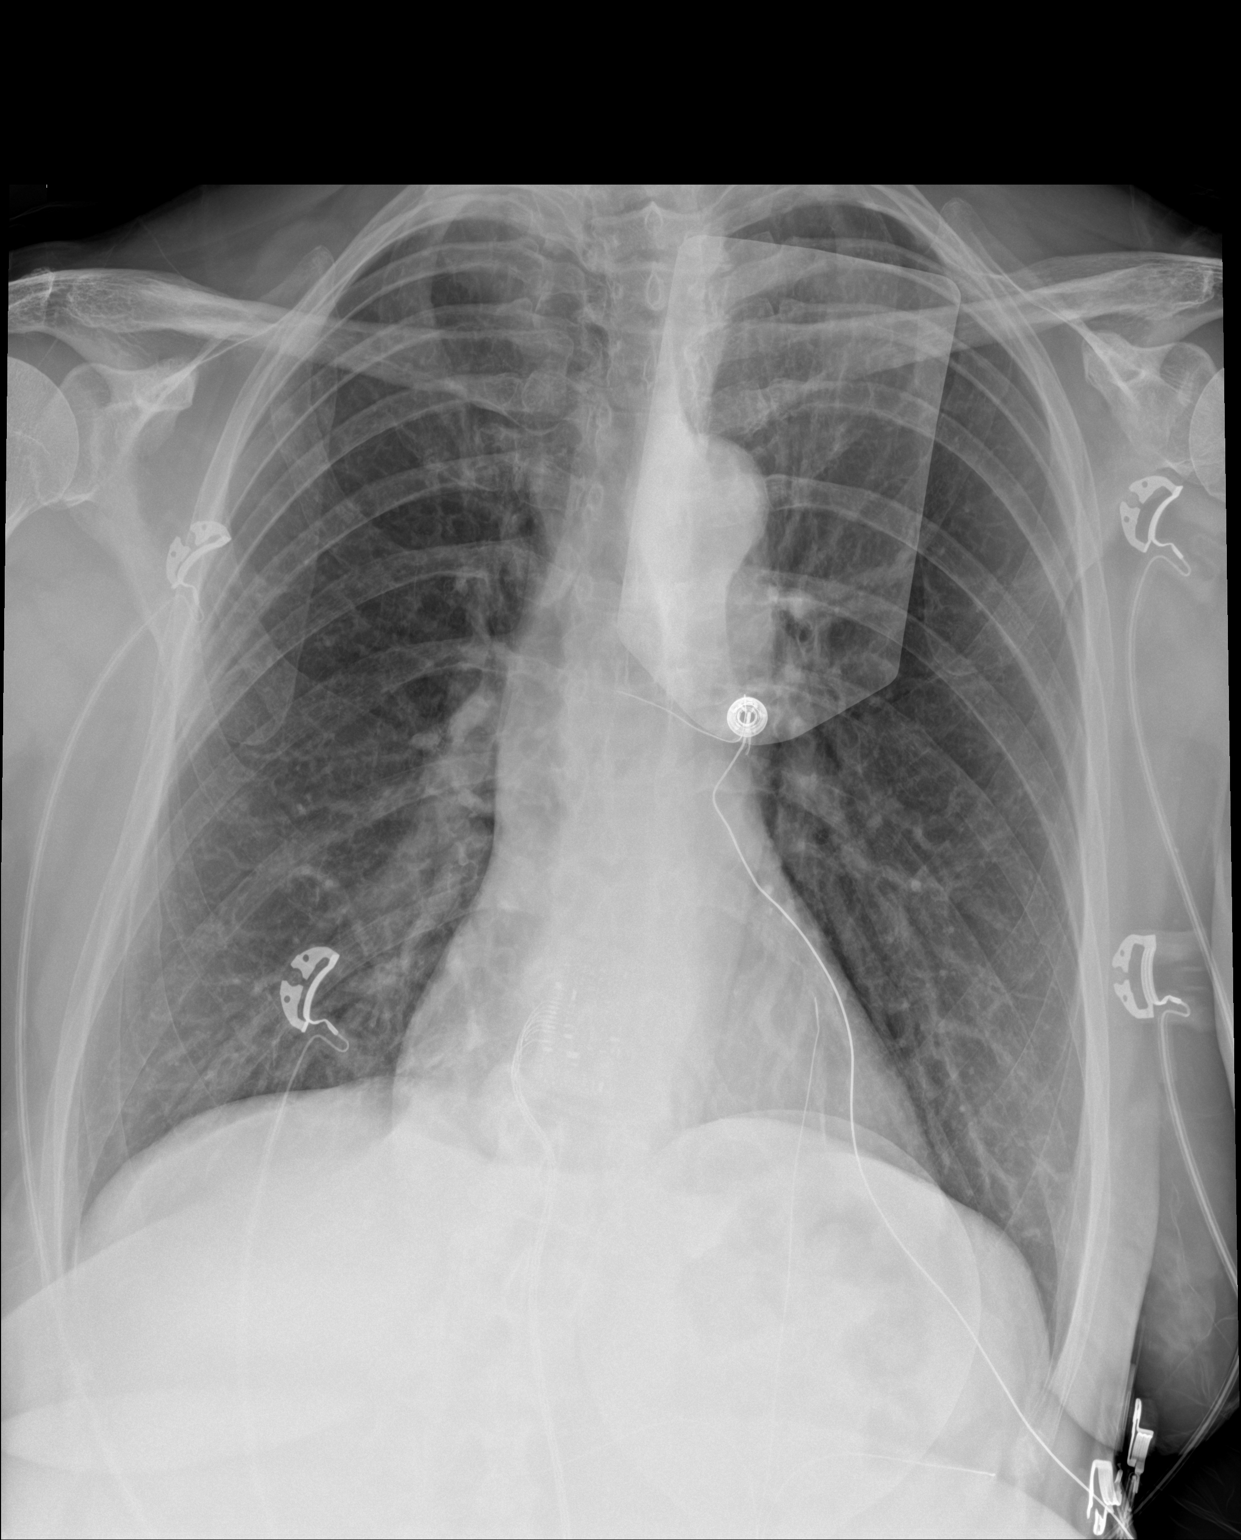

[1 of 1 positions shown; findings below may reference images not displayed]

FINDINGS: The heart size and mediastinal contours are within normal limits.
Both lungs are clear. The visualized skeletal structures are
unremarkable.
IMPRESSION: A transcutaneous pacer partially obscures the medial upper left
chest. No acute abnormalities otherwise seen.

## 2021-09-03 ENCOUNTER — Ambulatory Visit: Payer: Medicare HMO | Admitting: Dermatology

## 2021-09-03 DIAGNOSIS — Z1283 Encounter for screening for malignant neoplasm of skin: Secondary | ICD-10-CM | POA: Diagnosis not present

## 2021-09-03 DIAGNOSIS — L821 Other seborrheic keratosis: Secondary | ICD-10-CM

## 2021-09-03 DIAGNOSIS — Z86006 Personal history of melanoma in-situ: Secondary | ICD-10-CM

## 2021-09-03 DIAGNOSIS — L578 Other skin changes due to chronic exposure to nonionizing radiation: Secondary | ICD-10-CM | POA: Diagnosis not present

## 2021-09-03 DIAGNOSIS — L814 Other melanin hyperpigmentation: Secondary | ICD-10-CM | POA: Diagnosis not present

## 2021-09-03 DIAGNOSIS — D1800 Hemangioma unspecified site: Secondary | ICD-10-CM

## 2021-09-03 DIAGNOSIS — Z86018 Personal history of other benign neoplasm: Secondary | ICD-10-CM | POA: Diagnosis not present

## 2021-09-03 DIAGNOSIS — I8393 Asymptomatic varicose veins of bilateral lower extremities: Secondary | ICD-10-CM | POA: Diagnosis not present

## 2021-09-03 DIAGNOSIS — D229 Melanocytic nevi, unspecified: Secondary | ICD-10-CM | POA: Diagnosis not present

## 2021-09-03 DIAGNOSIS — D239 Other benign neoplasm of skin, unspecified: Secondary | ICD-10-CM

## 2021-09-03 NOTE — Patient Instructions (Signed)
Due to recent changes in healthcare laws, you may see results of your pathology and/or laboratory studies on MyChart before the doctors have had a chance to review them. We understand that in some cases there may be results that are confusing or concerning to you. Please understand that not all results are received at the same time and often the doctors may need to interpret multiple results in order to provide you with the best plan of care or course of treatment. Therefore, we ask that you please give us 2 business days to thoroughly review all your results before contacting the office for clarification. Should we see a critical lab result, you will be contacted sooner.   If You Need Anything After Your Visit  If you have any questions or concerns for your doctor, please call our main line at [phone] and press option 4 to reach your doctor's medical assistant. If no one answers, please leave a voicemail as directed and we will return your call as soon as possible. Messages left after 4 pm will be answered the following business day.   You may also send us a message via MyChart. We typically respond to MyChart messages within 1-2 business days.  For prescription refills, please ask your pharmacy to contact our office. Our fax number is [phone].  If you have an urgent issue when the clinic is closed that cannot wait until the next business day, you can page your doctor at the number below.    Please note that while we do our best to be available for urgent issues outside of office hours, we are not available 24/7.   If you have an urgent issue and are unable to reach us, you may choose to seek medical care at your doctor's office, retail clinic, urgent care center, or emergency room.  If you have a medical emergency, please immediately call 911 or go to the emergency department.  Pager Numbers  - Dr. Kowalski: [phone]  - Dr. Moye: [phone]  - Dr. Stewart:  [phone]  In the event of inclement weather, please call our main line at [phone] for an update on the status of any delays or closures.  Dermatology Medication Tips: Please keep the boxes that topical medications come in in order to help keep track of the instructions about where and how to use these. Pharmacies typically print the medication instructions only on the boxes and not directly on the medication tubes.   If your medication is too expensive, please contact our office at [phone] option 4 or send us a message through MyChart.   We are unable to tell what your co-pay for medications will be in advance as this is different depending on your insurance coverage. However, we may be able to find a substitute medication at lower cost or fill out paperwork to get insurance to cover a needed medication.   If a prior authorization is required to get your medication covered by your insurance company, please allow us 1-2 business days to complete this process.  Drug prices often vary depending on where the prescription is filled and some pharmacies may offer cheaper prices.  The website www.goodrx.com contains coupons for medications through different pharmacies. The prices here do not account for what the cost may be with help from insurance (it may be cheaper with your insurance), but the website can give you the price if you did not use any insurance.  - You can print the associated coupon and take it with   your prescription to the pharmacy.  - You may also stop by our office during regular business hours and pick up a GoodRx coupon card.  - If you need your prescription sent electronically to a different pharmacy, notify our office through [facility] MyChart or by phone at [phone] option 4.     Si Usted Necesita Algo Despus de Su Visita  Tambin puede enviarnos un mensaje a travs de MyChart. Por lo general respondemos a los mensajes de MyChart en el transcurso de 1 a 2  das hbiles.  Para renovar recetas, por favor pida a su farmacia que se ponga en contacto con nuestra oficina. Nuestro nmero de fax es el [phone].  Si tiene un asunto urgente cuando la clnica est cerrada y que no puede esperar hasta el siguiente da hbil, puede llamar/localizar a su doctor(a) al nmero que aparece a continuacin.   Por favor, tenga en cuenta que aunque hacemos todo lo posible para estar disponibles para asuntos urgentes fuera del horario de oficina, no estamos disponibles las 24 horas del da, los 7 das de la semana.   Si tiene un problema urgente y no puede comunicarse con nosotros, puede optar por buscar atencin mdica  en el consultorio de su doctor(a), en una clnica privada, en un centro de atencin urgente o en una sala de emergencias.  Si tiene una emergencia mdica, por favor llame inmediatamente al 911 o vaya a la sala de emergencias.  Nmeros de bper  - Dr. Kowalski: [phone]  - Dra. Moye: [phone]  - Dra. Stewart: [phone]  En caso de inclemencias del tiempo, por favor llame a nuestra lnea principal al [phone] para una actualizacin sobre el estado de cualquier retraso o cierre.  Consejos para la medicacin en dermatologa: Por favor, guarde las cajas en las que vienen los medicamentos de uso tpico para ayudarle a seguir las instrucciones sobre dnde y cmo usarlos. Las farmacias generalmente imprimen las instrucciones del medicamento slo en las cajas y no directamente en los tubos del medicamento.   Si su medicamento es muy caro, por favor, pngase en contacto con nuestra oficina llamando al [phone] y presione la opcin 4 o envenos un mensaje a travs de MyChart.   No podemos decirle cul ser su copago por los medicamentos por adelantado ya que esto es diferente dependiendo de la cobertura de su seguro. Sin embargo, es posible que podamos encontrar un medicamento sustituto a menor costo o llenar un formulario para que el  seguro cubra el medicamento que se considera necesario.   Si se requiere una autorizacin previa para que su compaa de seguros cubra su medicamento, por favor permtanos de 1 a 2 das hbiles para completar este proceso.  Los precios de los medicamentos varan con frecuencia dependiendo del lugar de dnde se surte la receta y alguna farmacias pueden ofrecer precios ms baratos.  El sitio web www.goodrx.com tiene cupones para medicamentos de diferentes farmacias. Los precios aqu no tienen en cuenta lo que podra costar con la ayuda del seguro (puede ser ms barato con su seguro), pero el sitio web puede darle el precio si no utiliz ningn seguro.  - Puede imprimir el cupn correspondiente y llevarlo con su receta a la farmacia.  - Tambin puede pasar por nuestra oficina durante el horario de atencin regular y recoger una tarjeta de cupones de GoodRx.  - Si necesita que su receta se enve electrnicamente a una farmacia diferente, informe a nuestra oficina a travs de MyChart de [facility]   o por telfono llamando al [phone] y presione la opcin 4.  

## 2021-09-03 NOTE — Progress Notes (Signed)
Follow-Up Visit   Subjective  Krystal Weaver is a 66 y.o. female who presents for the following: Total body skin exam (Hx of Melanoma IS R calf). The patient presents for Total-Body Skin Exam (TBSE) for skin cancer screening and mole check.  The patient has spots, moles and lesions to be evaluated, some may be new or changing and the patient has concerns that these could be cancer.   The following portions of the chart were reviewed this encounter and updated as appropriate:   Tobacco  Allergies  Meds  Problems  Med Hx  Surg Hx  Fam Hx     Review of Systems:  No other skin or systemic complaints except as noted in HPI or Assessment and Plan.  Objective  Well appearing patient in no apparent distress; mood and affect are within normal limits.  A full examination was performed including scalp, head, eyes, ears, nose, lips, neck, chest, axillae, abdomen, back, buttocks, bilateral upper extremities, bilateral lower extremities, hands, feet, fingers, toes, fingernails, and toenails. All findings within normal limits unless otherwise noted below.  R calf Well healed scar with no evidence of recurrence, no lymphadenopathy.     Assessment & Plan   Lentigines - Scattered tan macules - Due to sun exposure - Benign-appearing, observe - Recommend daily broad spectrum sunscreen SPF 30+ to sun-exposed areas, reapply every 2 hours as needed. - Call for any changes  Seborrheic Keratoses - Stuck-on, waxy, tan-brown papules and/or plaques  - Benign-appearing - Discussed benign etiology and prognosis. - Observe - Call for any changes  Melanocytic Nevi - Tan-brown and/or pink-flesh-colored symmetric macules and papules - Benign appearing on exam today - Observation - Call clinic for new or changing moles - Recommend daily use of broad spectrum spf 30+ sunscreen to sun-exposed areas.   Hemangiomas - Red papules - Discussed benign nature - Observe - Call for any  changes  Actinic Damage - Chronic condition, secondary to cumulative UV/sun exposure - diffuse scaly erythematous macules with underlying dyspigmentation - Recommend daily broad spectrum sunscreen SPF 30+ to sun-exposed areas, reapply every 2 hours as needed.  - Staying in the shade or wearing long sleeves, sun glasses (UVA+UVB protection) and wide brim hats (4-inch brim around the entire circumference of the hat) are also recommended for sun protection.  - Call for new or changing lesions.  Skin cancer screening performed today.   History of Dysplastic Nevi - No evidence of recurrence today - Recommend regular full body skin exams - Recommend daily broad spectrum sunscreen SPF 30+ to sun-exposed areas, reapply every 2 hours as needed.  - Call if any new or changing lesions are noted between office visits  - R med antecubital, L prox antecubital, L prox post lat thigh  History of melanoma in situ R calf WLE 2019 Clear.  No lymphadenopathy.  Observe for recurrence. Call clinic for new or changing lesions.  Recommend regular skin exams, daily broad-spectrum spf 30+ sunscreen use, and photoprotection.    Varicose Veins/Spider Veins - Dilated blue, purple or red veins at the lower extremities - Reassured - Smaller vessels can be treated by sclerotherapy (a procedure to inject a medicine into the veins to make them disappear) if desired, but the treatment is not covered by insurance. Larger vessels may be covered if symptomatic and we would refer to vascular surgeon if treatment desired.   Return in about 6 months (around 03/06/2022) for Hx of Melanoma IS, Hx of Dysplastic nevi.  I, Davy Pique  Hupman, RMA, am acting as scribe for [name], MD . Documentation: I have reviewed the above documentation for accuracy and completeness, and I agree with the above.  [name], MD

## 2021-09-05 ENCOUNTER — Encounter: Payer: Self-pay | Admitting: Dermatology

## 2021-09-16 ENCOUNTER — Ambulatory Visit: Payer: Medicare HMO

## 2021-09-17 ENCOUNTER — Ambulatory Visit (INDEPENDENT_AMBULATORY_CARE_PROVIDER_SITE_OTHER): Payer: Medicare HMO

## 2021-09-17 VITALS — Ht 65.0 in | Wt 143.0 lb

## 2021-09-17 DIAGNOSIS — Z0000 Encounter for general adult medical examination without abnormal findings: Secondary | ICD-10-CM

## 2021-09-17 NOTE — Patient Instructions (Addendum)
  Ms. Dovidio , Thank you for taking time to come for your Medicare Wellness Visit. I appreciate your ongoing commitment to your health goals. Please review the following plan we discussed and let me know if I can assist you in the future.   These are the goals we discussed:  Goals      Maintain Healthy Lifestyle        This is a list of the screening recommended for you and due dates:  Health Maintenance  Topic Date Due   DEXA scan (bone density measurement)  Never done   Colon Cancer Screening  03/03/2021   COVID-19 Vaccine (4 - Pfizer risk series) 10/03/2021*   Pneumonia Vaccine (1 - PCV) 12/11/2021*   Flu Shot  04/11/2022*   Mammogram  12/01/2021   Tetanus Vaccine  11/20/2029   Hepatitis C Screening: USPSTF Recommendation to screen - Ages 18-79 yo.  Completed   Zoster (Shingles) Vaccine  Completed   HPV Vaccine  Aged Out  *Topic was postponed. The date shown is not the original due date.

## 2021-09-17 NOTE — Progress Notes (Signed)
Subjective:   Krystal Weaver is a 66 y.o. female who presents for an Initial Medicare Annual Wellness Visit.  Review of Systems    No ROS.  Medicare Wellness Virtual Visit.  Visual/audio telehealth visit, UTA vital signs.   See social history for additional risk factors.   Cardiac Risk Factors include: advanced age (>34mn, >>39women)     Objective:    Today's Vitals   09/17/21 1409  Weight: 143 lb (64.9 kg)  Height: '5\' 5"'$  (1.651 m)   Body mass index is 23.8 kg/m.     09/17/2021    2:10 PM 10/07/2018    6:36 PM 08/12/2018   11:03 AM 03/22/2016    9:13 AM 11/03/2015    3:01 PM  Advanced Directives  Does Patient Have a Medical Advance Directive? No No No No No  Would patient like information on creating a medical advance directive? No - Patient declined No - Patient declined       Current Medications (verified) Outpatient Encounter Medications as of 09/17/2021  Medication Sig   ascorbic acid (VITAMIN C) 500 MG tablet Take by mouth.   Cholecalciferol (VITAMIN D3) 1000 UNITS CAPS Take 1 capsule by mouth 2 (two) times daily.   fish oil-omega-3 fatty acids 1000 MG capsule Take 1,200 g by mouth daily.   lamoTRIgine (LAMICTAL) 100 MG tablet Take 100 mg by mouth daily.   Magnesium 400 MG TABS Take by mouth.   Multiple Vitamins-Minerals (MULTIVITAMIN WITH MINERALS) tablet Take 1 tablet by mouth daily.   No facility-administered encounter medications on file as of 09/17/2021.    Allergies (verified) Patient has no known allergies.   History: Past Medical History:  Diagnosis Date   [place]    [place]   melanoma- rt lower leg   Depression    Bi polar type 2   Dysplastic nevus 06/28/2018   R medial antecubital/mod, L prox antecubital/mod   Dysplastic nevus 06/20/2019   Left proximal post. lat. thigh. Moderate to severe atypia, inflamed; close to margin. Excised: 07/31/2019, margins free.   [place]   right calf/in situ/WLE    Past Surgical History:  Procedure Laterality Date   BREAST CYST ASPIRATION  1983   Negative   BREAST EXCISIONAL BIOPSY Left 1982   Negative - Excisional   BREAST SURGERY Left 2004   MELANOMA EXCISION     Family History  Problem Relation Age of Onset   Alcohol abuse Mother    Heart disease Mother    Learning disabilities Mother    Mental illness Mother    Cancer Father [phone]      brain tumor   Alcohol abuse Sister    Cancer Sister    Alcohol abuse Son    Psoriasis Son    Breast cancer Sister [phone]  Cancer Maternal Aunt    Breast cancer Maternal Aunt 68   Melanoma Brother    Social History   Socioeconomic History   Marital status: Married    Spouse name: Not on file   Number of children: Not on file   Years of education: Not on file   Highest education level: Not on file  Occupational History   Not on file  Tobacco Use   Smoking status: Never   Smokeless tobacco: Never  Substance and Sexual Activity   Alcohol use: Yes    Alcohol/week: 5.0 standard drinks of alcohol    Types: 5 Glasses of wine per week  Drug use: No   Sexual activity: Yes  Other Topics Concern   Not on file  Social History Narrative   Not on file   Social Determinants of Health   Financial Resource Strain: Low Risk  (09/17/2021)   Overall Financial Resource Strain (CARDIA)    Difficulty of Paying Living Expenses: Not hard at all  Food Insecurity: No Food Insecurity (09/17/2021)   Hunger Vital Sign    Worried About Running Out of Food in the Last Year: Never true    Ran Out of Food in the Last Year: Never true  Transportation Needs: No Transportation Needs (09/17/2021)   PRAPARE - Hydrologist (Medical): No    Lack of Transportation (Non-Medical): No  Physical Activity: Not on file  Stress: No Stress Concern Present (09/17/2021)   [place]    Feeling of Stress : Not at all  Social Connections:  Unknown (09/17/2021)   Social Connection and Isolation Panel [NHANES]    Frequency of Communication with Friends and Family: Not on file    Frequency of Social Gatherings with Friends and Family: Not on file    Attends Religious Services: Not on file    Active Member of [place] or Organizations: Not on file    Attends Archivist Meetings: Not on file    Marital Status: Married    Tobacco Counseling Counseling given: Not Answered   Clinical Intake:  Pre-visit preparation completed: Yes        Diabetes: No  How often do you need to have someone help you when you read instructions, pamphlets, or other written materials from your doctor or pharmacy?: 1 - Never    Interpreter Needed?: No      Activities of Daily Living    09/17/2021    2:12 PM 01/07/2021    4:33 PM  In your present state of health, do you have any difficulty performing the following activities:  Hearing? 0 0  Vision? 0 0  Difficulty concentrating or making decisions? 0 0  Walking or climbing stairs? 0 0  Comment Chronic knee pain. Paces self with activity.Ambulates with hiking stick with outdoors physical activity. Cane as needed.   Dressing or bathing? 0 0  Doing errands, shopping? 0 0  Preparing Food and eating ? N   Using the Toilet? N   In the past six months, have you accidently leaked urine? N   Do you have problems with loss of bowel control? N   Managing your Medications? N   Managing your Finances? N   Housekeeping or managing your Housekeeping? N     Patient Care Team: Crecencio Mc, MD as PCP - General (Internal Medicine)  Indicate any recent Medical Services you may have received from other than [facility] providers in the past year (date may be approximate).     Assessment:   This is a routine wellness examination for [facility].  Virtual Visit via Telephone Note  I connected with  Krystal Weaver on 09/17/21 at  2:00 PM EDT by telephone and verified that I am speaking with the  correct person using two identifiers.  Location: Patient: home Provider: office Persons participating in the virtual visit: patient/Nurse Health Advisor   I discussed the limitations, risks, security and privacy concerns of performing an evaluation and management service by telehealth. We continued and completed visit with audio only. Some vital signs may be absent or patient reported.  Hearing/Vision screen Hearing Screening - Comments:: Patient is able to hear conversational tones without difficulty.  No issues reported. Vision Screening - Comments:: Followed by [facility] Wears corrective lenses Annual visits They have seen their ophthalmologist in the last 12 months.     Dietary issues and exercise activities discussed: Current Exercise Habits: Home exercise routine, Type of exercise: walking (water aerobics. pickle ball.), Time (Minutes): > 60, Frequency (Times/Week): 5, Weekly Exercise (Minutes/Week): 0, Intensity: Moderate   Goals Addressed             This Visit's Progress    Maintain Healthy Lifestyle         Depression Screen    09/17/2021    2:12 PM 09/17/2021    2:11 PM 01/07/2021    4:33 PM  PHQ 2/9 Scores  PHQ - 2 Score 0 0 0    Fall Risk    09/17/2021    2:15 PM 01/07/2021    4:33 PM 11/21/2019    3:39 PM 10/12/2018    8:20 AM  [place] in the past year?  1 0 1  Number falls in past yr:  0  1  Injury with Fall?  0  0  Risk for fall due to :  History of fall(s)    Follow up Falls evaluation completed Falls evaluation completed Falls evaluation completed Falls evaluation completed    [place]: Home free of loose throw rugs in walkways, pet beds, electrical cords, etc? Yes  Adequate lighting in your home to reduce risk of falls? Yes   ASSISTIVE DEVICES UTILIZED TO PREVENT FALLS: Life alert? No  Use of a cane, walker or w/c? Yes , as needed.   TIMED UP AND GO: Was the test performed? No .   Cognitive  Function:  Patient is alert and oriented x3.  100% independent.  Denies difficulty with focusing, making decisions and memory loss.       09/17/2021    2:39 PM  6CIT Screen  What Year? 0 points  What month? 0 points  What time? 0 points    Immunizations Immunization History  Administered Date(s) Administered   Influenza Split 09/19/2014   Influenza-Unspecified 11/09/2019, 10/20/2020   PFIZER(Purple Top)SARS-COV-2 Vaccination 01/23/2019, 02/13/2019, 10/26/2019   Tdap 08/24/2008, 11/21/2019   Zoster Recombinat (Shingrix) 06/15/2019, 08/24/2019   Flu Vaccine status: Due, Education has been provided regarding the importance of this vaccine. Advised may receive this vaccine at local pharmacy or Health Dept. Aware to provide a copy of the vaccination record if obtained from local pharmacy or Health Dept. Verbalized acceptance and understanding.  Pneumococcal vaccine status: Declined,  Education has been provided regarding the importance of this vaccine but patient still declined. Advised may receive this vaccine at local pharmacy or Health Dept. Aware to provide a copy of the vaccination record if obtained from local pharmacy or Health Dept. Verbalized acceptance and understanding.  Deferred.   Covid-19 vaccine status: Completed vaccines  Screening Tests Health Maintenance  Topic Date Due   DEXA SCAN  Never done   COLONOSCOPY (Pts 45-90yr Insurance coverage will need to be confirmed)  03/03/2021   COVID-19 Vaccine (4 - Pfizer risk series) 10/03/2021 (Originally 12/21/2019)   Pneumonia Vaccine 67 Years old (1 - PCV) 12/11/2021 (Originally 11/25/2019)   INFLUENZA VACCINE  04/11/2022 (Originally 08/11/2021)   MAMMOGRAM  12/01/2021   TETANUS/TDAP  11/20/2029   Hepatitis C Screening  Completed   Zoster Vaccines- Shingrix  Completed   HPV VACCINES  Aged Out   Health Maintenance Health Maintenance Due  Topic Date Due   DEXA SCAN  Never done   COLONOSCOPY (Pts 45-65yr Insurance  coverage will need to be confirmed)  03/03/2021   Lung Cancer Screening: (Low Dose CT Chest recommended if Age 63105-80years, 30 pack-year currently smoking OR have quit w/in 15years.) does not qualify.   Vision Screening: Recommended annual ophthalmology exams for early detection of glaucoma and other disorders of the eye.  Dental Screening: Recommended annual dental exams for proper oral hygiene  Community Resource Referral / Chronic Care Management: CRR required this visit?  No   CCM required this visit?  No      Plan:     I have personally reviewed and noted the following in the patient's chart:   Medical and social history Use of alcohol, tobacco or illicit drugs  Current medications and supplements including opioid prescriptions. Patient is not currently taking opioid prescriptions. Functional ability and status Nutritional status Physical activity Advanced directives List of other physicians Hospitalizations, surgeries, and ER visits in previous 12 months Vitals Screenings to include cognitive, depression, and falls Referrals and appointments  In addition, I have reviewed and discussed with patient certain preventive protocols, quality metrics, and best practice recommendations. A written personalized care plan for preventive services as well as general preventive health recommendations were provided to patient.     OVarney Biles LPN   [id number]    Agree Dr. TOlivia MackieMcLean-Scocuzza

## 2021-09-23 DIAGNOSIS — H524 Presbyopia: Secondary | ICD-10-CM | POA: Diagnosis not present

## 2021-10-14 DIAGNOSIS — M9903 Segmental and somatic dysfunction of lumbar region: Secondary | ICD-10-CM | POA: Diagnosis not present

## 2021-10-14 DIAGNOSIS — M5451 Vertebrogenic low back pain: Secondary | ICD-10-CM | POA: Diagnosis not present

## 2021-10-14 DIAGNOSIS — M9901 Segmental and somatic dysfunction of cervical region: Secondary | ICD-10-CM | POA: Diagnosis not present

## 2021-10-14 DIAGNOSIS — M542 Cervicalgia: Secondary | ICD-10-CM | POA: Diagnosis not present

## 2021-10-14 DIAGNOSIS — M9904 Segmental and somatic dysfunction of sacral region: Secondary | ICD-10-CM | POA: Diagnosis not present

## 2021-10-14 DIAGNOSIS — M7918 Myalgia, other site: Secondary | ICD-10-CM | POA: Diagnosis not present

## 2021-11-27 ENCOUNTER — Ambulatory Visit: Payer: Medicare HMO | Admitting: Internal Medicine

## 2021-12-02 ENCOUNTER — Ambulatory Visit: Payer: Medicare HMO | Admitting: Internal Medicine

## 2021-12-17 DIAGNOSIS — M9903 Segmental and somatic dysfunction of lumbar region: Secondary | ICD-10-CM | POA: Diagnosis not present

## 2021-12-17 DIAGNOSIS — M9904 Segmental and somatic dysfunction of sacral region: Secondary | ICD-10-CM | POA: Diagnosis not present

## 2021-12-17 DIAGNOSIS — M5451 Vertebrogenic low back pain: Secondary | ICD-10-CM | POA: Diagnosis not present

## 2021-12-17 DIAGNOSIS — M542 Cervicalgia: Secondary | ICD-10-CM | POA: Diagnosis not present

## 2021-12-17 DIAGNOSIS — M7918 Myalgia, other site: Secondary | ICD-10-CM | POA: Diagnosis not present

## 2021-12-17 DIAGNOSIS — M9901 Segmental and somatic dysfunction of cervical region: Secondary | ICD-10-CM | POA: Diagnosis not present

## 2021-12-28 ENCOUNTER — Ambulatory Visit (INDEPENDENT_AMBULATORY_CARE_PROVIDER_SITE_OTHER): Payer: Medicare HMO | Admitting: Internal Medicine

## 2021-12-28 ENCOUNTER — Encounter: Payer: Self-pay | Admitting: Internal Medicine

## 2021-12-28 VITALS — BP 108/64 | HR 74 | Temp 98.2°F | Ht 65.0 in | Wt 136.4 lb

## 2021-12-28 DIAGNOSIS — E559 Vitamin D deficiency, unspecified: Secondary | ICD-10-CM

## 2021-12-28 DIAGNOSIS — Z1211 Encounter for screening for malignant neoplasm of colon: Secondary | ICD-10-CM | POA: Diagnosis not present

## 2021-12-28 DIAGNOSIS — C439 Malignant melanoma of skin, unspecified: Secondary | ICD-10-CM | POA: Diagnosis not present

## 2021-12-28 DIAGNOSIS — E782 Mixed hyperlipidemia: Secondary | ICD-10-CM | POA: Diagnosis not present

## 2021-12-28 DIAGNOSIS — Z1231 Encounter for screening mammogram for malignant neoplasm of breast: Secondary | ICD-10-CM | POA: Diagnosis not present

## 2021-12-28 DIAGNOSIS — R0683 Snoring: Secondary | ICD-10-CM | POA: Diagnosis not present

## 2021-12-28 DIAGNOSIS — R7301 Impaired fasting glucose: Secondary | ICD-10-CM | POA: Diagnosis not present

## 2021-12-28 DIAGNOSIS — Z0000 Encounter for general adult medical examination without abnormal findings: Secondary | ICD-10-CM | POA: Diagnosis not present

## 2021-12-28 DIAGNOSIS — Z79899 Other long term (current) drug therapy: Secondary | ICD-10-CM | POA: Diagnosis not present

## 2021-12-28 DIAGNOSIS — Z780 Asymptomatic menopausal state: Secondary | ICD-10-CM | POA: Diagnosis not present

## 2021-12-28 NOTE — Assessment & Plan Note (Signed)

## 2021-12-28 NOTE — Assessment & Plan Note (Signed)
She has had no recurrence since her excision Nov 2019 and is now on 6 month surveillance.

## 2021-12-28 NOTE — Patient Instructions (Signed)
Your annual mammogram has been ordered.  You are encouraged (required) to call to make your appointment at [facility]   Congratulations on your decision to retire !

## 2021-12-28 NOTE — Progress Notes (Signed)
Patient ID: Krystal Weaver, female    DOB: 04/28/1955  Age: 66 y.o. MRN: [id number]  The patient is here for annual preventivee  examination and management of other chronic and acute problems.   The risk factors are reflected in the social history.  The roster of all physicians providing medical care to patient - is listed in the Snapshot section of the chart.  Activities of daily living:  The patient is 100% independent in all ADLs: dressing, toileting, feeding as well as independent mobility  Home safety : The patient has smoke detectors in the home. They wear seatbelts.  There are no firearms at home. There is no violence in the home.   There is no risks for hepatitis, STDs or HIV. There is no   history of blood transfusion. They have no travel history to infectious disease endemic areas of the world.  The patient has seen their dentist in the last six month. They have seen their eye doctor in the last year. They admit to slight hearing difficulty with regard to whispered voices and some television programs.  They have deferred audiologic testing in the last year.  They do not  have excessive sun exposure. Discussed the need for sun protection: hats, long sleeves and use of sunscreen if there is significant sun exposure.   Diet: the importance of a healthy diet is discussed. They do have a healthy diet.  The benefits of regular aerobic exercise were discussed. She exercises  4 times per week ,  60 minutes.   Depression screen: there are no signs or vegative symptoms of depression- irritability, change in appetite, anhedonia, sadness/tearfullness.  Cognitive assessment: the patient manages all their financial and personal affairs and is actively engaged. They could relate day,date,year and events; recalled 2/3 objects at 3 minutes; performed clock-face test normally.  The following portions of the patient's history were reviewed and updated as appropriate: allergies, current medications,  past family history, past medical history,  past surgical history, past social history  and problem list.  Visual acuity was not assessed per patient preference since she has regular follow up with her ophthalmologist. Hearing and body mass index were assessed and reviewed.   During the course of the visit the patient was educated and counseled about appropriate screening and preventive services including : fall prevention , diabetes screening, nutrition counseling, colorectal cancer screening, and recommended immunizations.    CC: The primary encounter diagnosis was Encounter for screening mammogram for malignant neoplasm of breast. Diagnoses of Postmenopausal estrogen deficiency, Mixed hyperlipidemia, Long-term use of high-risk medication, Colon cancer screening, Impaired fasting glucose, Vitamin D deficiency, Habitual snoring, Encounter for preventive health examination, and Melanoma of skin ([place]) were also pertinent to this visit.   1) chronic snoring; waking up frequently despite using a snore guard. .  Abnormal breathing patterns noticed by husband.  No prior sleep study   2) history of melaonoma left lower leg in 2019  Takes 2000  ius of D3 daily.  Sees kowalski regularly.    History Krystal Weaver has a past medical history of Bipolar 2 disorder ([place]), Cancer ([place]) (11/2016), Depression, Dysplastic nevus (06/28/2018), Dysplastic nevus (06/20/2019), and Melanoma ([place]) (11/29/2017).   She has a past surgical history that includes Breast surgery (Left, 2004); Melanoma excision; Breast excisional biopsy (Left, 1982); and Breast cyst aspiration (3419).   Her family history includes Alcohol abuse in her mother, sister, and son; Breast cancer (age of onset: 20) in her sister; Breast cancer (age of onset:  53) in her maternal aunt; Cancer in her maternal aunt and sister; Cancer (age of onset: 77) in her father; Heart disease in her mother; Learning disabilities in her mother; Melanoma in her brother;  Mental illness in her mother; Psoriasis in her son.She reports that she has never smoked. She has never used smokeless tobacco. She reports current alcohol use of about 5.0 standard drinks of alcohol per week. She reports that she does not use drugs.  Outpatient Medications Prior to Visit  Medication Sig Dispense Refill   ascorbic acid (VITAMIN C) 500 MG tablet Take by mouth.     Cholecalciferol (VITAMIN D3) 1000 UNITS CAPS Take 1 capsule by mouth 2 (two) times daily.     fish oil-omega-3 fatty acids 1000 MG capsule Take 1,200 g by mouth daily.     lamoTRIgine (LAMICTAL) 100 MG tablet Take 100 mg by mouth daily.     Magnesium 400 MG TABS Take by mouth.     Multiple Vitamins-Minerals (MULTIVITAMIN WITH MINERALS) tablet Take 1 tablet by mouth daily.     No facility-administered medications prior to visit.    Review of Systems  Patient denies headache, fevers, malaise, unintentional weight loss, skin rash, eye pain, sinus congestion and sinus pain, sore throat, dysphagia,  hemoptysis , cough, dyspnea, wheezing, chest pain, palpitations, orthopnea, edema, abdominal pain, nausea, melena, diarrhea, constipation, flank pain, dysuria, hematuria, urinary  Frequency, nocturia, numbness, tingling, seizures,  Focal weakness, Loss of consciousness,  Tremor, insomnia, depression, anxiety, and suicidal ideation.     Objective:  BP 108/64   Pulse 74   Temp 98.2 F (36.8 C) (Oral)   Ht '5\' 5"'$  (1.651 m)   Wt 136 lb 6.4 oz (61.9 kg)   SpO2 98%   BMI 22.70 kg/m   Physical Exam Vitals reviewed.  Constitutional:      General: She is not in acute distress.    Appearance: Normal appearance. She is well-developed and normal weight. She is not ill-appearing, toxic-appearing or diaphoretic.  HENT:     Head: Normocephalic.     Right Ear: Tympanic membrane, ear canal and external ear normal. There is no impacted cerumen.     Left Ear: Tympanic membrane, ear canal and external ear normal. There is no impacted  cerumen.     Nose: Nose normal.     Mouth/Throat:     Mouth: Mucous membranes are moist.     Pharynx: Oropharynx is clear.  Eyes:     General: No scleral icterus.       Right eye: No discharge.        Left eye: No discharge.     Conjunctiva/sclera: Conjunctivae normal.     Pupils: Pupils are equal, round, and reactive to light.  Neck:     Thyroid: No thyromegaly.     Vascular: No carotid bruit or JVD.  Cardiovascular:     Rate and Rhythm: Normal rate and regular rhythm.     Heart sounds: Normal heart sounds.  Pulmonary:     Effort: Pulmonary effort is normal. No respiratory distress.     Breath sounds: Normal breath sounds.  Chest:  Breasts:    Breasts are symmetrical.     Right: Normal. No swelling, inverted nipple, mass, nipple discharge, skin change or tenderness.     Left: Normal. No swelling, inverted nipple, mass, nipple discharge, skin change or tenderness.  Abdominal:     General: Bowel sounds are normal.     Palpations: Abdomen is soft. There is no  mass.     Tenderness: There is no abdominal tenderness. There is no guarding or rebound.  Musculoskeletal:        General: Normal range of motion.     Cervical back: Normal range of motion and neck supple.  Lymphadenopathy:     Cervical: No cervical adenopathy.     Upper Body:     Right upper body: No supraclavicular, axillary or pectoral adenopathy.     Left upper body: No supraclavicular, axillary or pectoral adenopathy.  Skin:    General: Skin is warm and dry.  Neurological:     General: No focal deficit present.     Mental Status: She is alert and oriented to person, place, and time. Mental status is at baseline.  Psychiatric:        Mood and Affect: Mood normal.        Behavior: Behavior normal.        Thought Content: Thought content normal.        Judgment: Judgment normal.     Assessment & Plan:  Encounter for screening mammogram for malignant neoplasm of breast -     3D Screening Mammogram, Left and  Right; Future  Postmenopausal estrogen deficiency -     DG Bone Density; Future  Mixed hyperlipidemia -     Comprehensive metabolic panel -     Hemoglobin A1c -     Lipid panel -     LDL cholesterol, direct  Long-term use of high-risk medication -     TSH -     CBC with Differential/Platelet  Colon cancer screening  Impaired fasting glucose -     Comprehensive metabolic panel -     Hemoglobin A1c -     Microalbumin / creatinine urine ratio  Vitamin D deficiency -     VITAMIN D 25 Hydroxy (Vit-D Deficiency, Fractures)  Habitual snoring -     Home sleep test  Encounter for preventive health examination Assessment & Plan: age appropriate education and counseling updated, referrals for preventative services and immunizations addressed, dietary and smoking counseling addressed, most recent labs reviewed.  I have personally reviewed and have noted:   1) the patient's medical and social history 2) The pt's use of alcohol, tobacco, and illicit drugs 3) The patient's current medications and supplements 4) Functional ability including ADL's, fall risk, home safety risk, hearing and visual impairment 5) Diet and physical activities 6) Evidence for depression or mood disorder 7) The patient's height, weight, and BMI have been recorded in the chartI have made referrals, and provided counseling and education based on review of the above    Melanoma of skin [facility]) Assessment & Plan: She has had no recurrence since her excision Nov 2019 and is now on 6 month surveillance.        I provided 40 minutes of  face-to-face time during this encounter reviewing patient's current problems and past surgeries,  recent labs and imaging studies, providing counseling on the above mentioned problems , and coordination  of care .   Follow-up: Return in about 1 year (around 12/29/2022).   Crecencio Mc, MD

## 2021-12-29 LAB — VITAMIN D 25 HYDROXY (VIT D DEFICIENCY, FRACTURES): VITD: 29.62 ng/mL — ABNORMAL LOW (ref 30.00–100.00)

## 2021-12-29 LAB — CBC WITH DIFFERENTIAL/PLATELET
Basophils Absolute: 0.1 10*3/uL (ref 0.0–0.1)
Basophils Relative: 0.9 % (ref 0.0–3.0)
Eosinophils Absolute: 0.1 10*3/uL (ref 0.0–0.7)
Eosinophils Relative: 1 % (ref 0.0–5.0)
HCT: 39 % (ref 36.0–46.0)
Hemoglobin: 13.1 g/dL (ref 12.0–15.0)
Lymphocytes Relative: 28.2 % (ref 12.0–46.0)
Lymphs Abs: 2.1 10*3/uL (ref 0.7–4.0)
MCHC: 33.7 g/dL (ref 30.0–36.0)
MCV: 89.9 fl (ref 78.0–100.0)
Monocytes Absolute: 0.4 10*3/uL (ref 0.1–1.0)
Monocytes Relative: 5.6 % (ref 3.0–12.0)
Neutro Abs: 4.8 10*3/uL (ref 1.4–7.7)
Neutrophils Relative %: 64.3 % (ref 43.0–77.0)
Platelets: 275 10*3/uL (ref 150.0–400.0)
RBC: 4.34 Mil/uL (ref 3.87–5.11)
RDW: 12.6 % (ref 11.5–15.5)
WBC: 7.4 10*3/uL (ref 4.0–10.5)

## 2021-12-29 LAB — COMPREHENSIVE METABOLIC PANEL
ALT: 13 U/L (ref 0–35)
AST: 18 U/L (ref 0–37)
Albumin: 4.5 g/dL (ref 3.5–5.2)
Alkaline Phosphatase: 53 U/L (ref 39–117)
BUN: 11 mg/dL (ref 6–23)
CO2: 29 mEq/L (ref 19–32)
Calcium: 9.5 mg/dL (ref 8.4–10.5)
Chloride: 102 mEq/L (ref 96–112)
Creatinine, Ser: 0.84 mg/dL (ref 0.40–1.20)
GFR: 72.07 mL/min (ref >60.00)
Glucose, Bld: 90 mg/dL (ref 70–99)
Potassium: 3.8 mEq/L (ref 3.5–5.1)
Sodium: 139 mEq/L (ref 135–145)
Total Bilirubin: 0.4 mg/dL (ref 0.2–1.2)
Total Protein: 6.1 g/dL (ref 6.0–8.3)

## 2021-12-29 LAB — MICROALBUMIN / CREATININE URINE RATIO
Creatinine,U: 68.3 mg/dL
Microalb Creat Ratio: 1 mg/g (ref 0.0–30.0)
Microalb, Ur: <0.7 mg/dL (ref 0.0–1.9)

## 2021-12-29 LAB — LDL CHOLESTEROL, DIRECT: Direct LDL: 139 mg/dL

## 2021-12-29 LAB — LIPID PANEL
Cholesterol: 248 mg/dL — ABNORMAL HIGH (ref 0–200)
HDL: 66.5 mg/dL (ref >39.00)
NonHDL: 181.37
Total CHOL/HDL Ratio: 4
Triglycerides: 229 mg/dL — ABNORMAL HIGH (ref 0.0–149.0)
VLDL: 45.8 mg/dL — ABNORMAL HIGH (ref 0.0–40.0)

## 2021-12-29 LAB — HEMOGLOBIN A1C: Hgb A1c MFr Bld: 5.7 % (ref 4.6–6.5)

## 2021-12-29 LAB — TSH: TSH: 0.79 u[IU]/mL (ref 0.35–5.50)

## 2022-01-08 ENCOUNTER — Encounter: Payer: Self-pay | Admitting: Internal Medicine

## 2022-01-12 ENCOUNTER — Ambulatory Visit
Admission: RE | Admit: 2022-01-12 | Discharge: 2022-01-12 | Disposition: A | Discharge status: Home / Self Care | Payer: Medicare HMO | Source: Ambulatory Visit | Attending: Internal Medicine | Admitting: Internal Medicine

## 2022-01-12 DIAGNOSIS — Z780 Asymptomatic menopausal state: Secondary | ICD-10-CM | POA: Diagnosis not present

## 2022-01-12 DIAGNOSIS — R928 Other abnormal and inconclusive findings on diagnostic imaging of breast: Secondary | ICD-10-CM

## 2022-01-12 DIAGNOSIS — M8580 Other specified disorders of bone density and structure, unspecified site: Secondary | ICD-10-CM | POA: Diagnosis not present

## 2022-01-12 DIAGNOSIS — M8589 Other specified disorders of bone density and structure, multiple sites: Secondary | ICD-10-CM | POA: Diagnosis not present

## 2022-01-12 DIAGNOSIS — Z1231 Encounter for screening mammogram for malignant neoplasm of breast: Secondary | ICD-10-CM

## 2022-01-14 ENCOUNTER — Other Ambulatory Visit: Payer: Self-pay | Admitting: Internal Medicine

## 2022-01-14 DIAGNOSIS — M8580 Other specified disorders of bone density and structure, unspecified site: Secondary | ICD-10-CM | POA: Insufficient documentation

## 2022-01-14 DIAGNOSIS — Z780 Asymptomatic menopausal state: Secondary | ICD-10-CM | POA: Insufficient documentation

## 2022-01-14 DIAGNOSIS — R928 Other abnormal and inconclusive findings on diagnostic imaging of breast: Secondary | ICD-10-CM

## 2022-01-14 NOTE — Assessment & Plan Note (Signed)
T score -1.6 by DEXA Jan 2024 repeat in 2 years

## 2022-01-14 NOTE — Assessment & Plan Note (Signed)
Krystal Weaver  has reported that Her mammogram was abnormal on the   right .   She will be contacted to get additional films and an ultrasound the facility.

## 2022-01-19 ENCOUNTER — Ambulatory Visit
Admission: RE | Admit: 2022-01-19 | Discharge: 2022-01-19 | Disposition: A | Discharge status: Home / Self Care | Payer: Medicare HMO | Source: Ambulatory Visit | Attending: Internal Medicine | Admitting: Internal Medicine

## 2022-01-19 DIAGNOSIS — R928 Other abnormal and inconclusive findings on diagnostic imaging of breast: Secondary | ICD-10-CM | POA: Insufficient documentation

## 2022-01-19 DIAGNOSIS — R922 Inconclusive mammogram: Secondary | ICD-10-CM | POA: Diagnosis not present

## 2022-01-19 DIAGNOSIS — N6489 Other specified disorders of breast: Secondary | ICD-10-CM | POA: Diagnosis not present

## 2022-01-28 ENCOUNTER — Other Ambulatory Visit: Payer: Medicare HMO

## 2022-02-09 NOTE — Telephone Encounter (Signed)
Home sleep study was ordered on 12/28/2021. Pt stated that she still has not heard anything.

## 2022-02-16 DIAGNOSIS — R0602 Shortness of breath: Secondary | ICD-10-CM | POA: Diagnosis not present

## 2022-02-16 DIAGNOSIS — G4733 Obstructive sleep apnea (adult) (pediatric): Secondary | ICD-10-CM | POA: Diagnosis not present

## 2022-02-17 DIAGNOSIS — G4733 Obstructive sleep apnea (adult) (pediatric): Secondary | ICD-10-CM | POA: Diagnosis not present

## 2022-02-17 DIAGNOSIS — R0602 Shortness of breath: Secondary | ICD-10-CM | POA: Diagnosis not present

## 2022-02-25 ENCOUNTER — Telehealth: Payer: Self-pay | Admitting: Internal Medicine

## 2022-02-25 DIAGNOSIS — I471 Supraventricular tachycardia, unspecified: Secondary | ICD-10-CM

## 2022-02-25 NOTE — Assessment & Plan Note (Signed)
Her  Home sleep study  done Feb 2024  was minimal to moderately positive.  (AHI 18 on night 1,  8.5 on night 2) She did not desat and her pulse was < 100  bpm 99% of the time

## 2022-03-01 NOTE — Telephone Encounter (Signed)
CPAP order has been submitted.

## 2022-03-04 DIAGNOSIS — M9904 Segmental and somatic dysfunction of sacral region: Secondary | ICD-10-CM | POA: Diagnosis not present

## 2022-03-04 DIAGNOSIS — M542 Cervicalgia: Secondary | ICD-10-CM | POA: Diagnosis not present

## 2022-03-04 DIAGNOSIS — M9903 Segmental and somatic dysfunction of lumbar region: Secondary | ICD-10-CM | POA: Diagnosis not present

## 2022-03-04 DIAGNOSIS — M7918 Myalgia, other site: Secondary | ICD-10-CM | POA: Diagnosis not present

## 2022-03-04 DIAGNOSIS — M9901 Segmental and somatic dysfunction of cervical region: Secondary | ICD-10-CM | POA: Diagnosis not present

## 2022-03-04 DIAGNOSIS — M5451 Vertebrogenic low back pain: Secondary | ICD-10-CM | POA: Diagnosis not present

## 2022-03-05 DIAGNOSIS — G4733 Obstructive sleep apnea (adult) (pediatric): Secondary | ICD-10-CM | POA: Diagnosis not present

## 2022-03-18 ENCOUNTER — Ambulatory Visit: Payer: Medicare HMO | Admitting: Dermatology

## 2022-03-18 VITALS — BP 141/74 | HR 73

## 2022-03-18 DIAGNOSIS — D229 Melanocytic nevi, unspecified: Secondary | ICD-10-CM

## 2022-03-18 DIAGNOSIS — L578 Other skin changes due to chronic exposure to nonionizing radiation: Secondary | ICD-10-CM

## 2022-03-18 DIAGNOSIS — Z86018 Personal history of other benign neoplasm: Secondary | ICD-10-CM | POA: Diagnosis not present

## 2022-03-18 DIAGNOSIS — L821 Other seborrheic keratosis: Secondary | ICD-10-CM | POA: Diagnosis not present

## 2022-03-18 DIAGNOSIS — Z86006 Personal history of melanoma in-situ: Secondary | ICD-10-CM | POA: Diagnosis not present

## 2022-03-18 DIAGNOSIS — L814 Other melanin hyperpigmentation: Secondary | ICD-10-CM

## 2022-03-18 DIAGNOSIS — D1801 Hemangioma of skin and subcutaneous tissue: Secondary | ICD-10-CM

## 2022-03-18 DIAGNOSIS — Z85820 Personal history of malignant melanoma of skin: Secondary | ICD-10-CM

## 2022-03-18 DIAGNOSIS — Z1283 Encounter for screening for malignant neoplasm of skin: Secondary | ICD-10-CM | POA: Diagnosis not present

## 2022-03-18 NOTE — Patient Instructions (Signed)
Due to recent changes in healthcare laws, you may see results of your pathology and/or laboratory studies on MyChart before the doctors have had a chance to review them. We understand that in some cases there may be results that are confusing or concerning to you. Please understand that not all results are received at the same time and often the doctors may need to interpret multiple results in order to provide you with the best plan of care or course of treatment. Therefore, we ask that you please give us 2 business days to thoroughly review all your results before contacting the office for clarification. Should we see a critical lab result, you will be contacted sooner.   If You Need Anything After Your Visit  If you have any questions or concerns for your doctor, please call our main line at [phone] and press option 4 to reach your doctor's medical assistant. If no one answers, please leave a voicemail as directed and we will return your call as soon as possible. Messages left after 4 pm will be answered the following business day.   You may also send us a message via MyChart. We typically respond to MyChart messages within 1-2 business days.  For prescription refills, please ask your pharmacy to contact our office. Our fax number is [phone].  If you have an urgent issue when the clinic is closed that cannot wait until the next business day, you can page your doctor at the number below.    Please note that while we do our best to be available for urgent issues outside of office hours, we are not available 24/7.   If you have an urgent issue and are unable to reach us, you may choose to seek medical care at your doctor's office, retail clinic, urgent care center, or emergency room.  If you have a medical emergency, please immediately call 911 or go to the emergency department.  Pager Numbers  - Dr. Kowalski: [phone]  - Dr. Moye: [phone]  - Dr. Stewart:  [phone]  In the event of inclement weather, please call our main line at [phone] for an update on the status of any delays or closures.  Dermatology Medication Tips: Please keep the boxes that topical medications come in in order to help keep track of the instructions about where and how to use these. Pharmacies typically print the medication instructions only on the boxes and not directly on the medication tubes.   If your medication is too expensive, please contact our office at [phone] option 4 or send us a message through MyChart.   We are unable to tell what your co-pay for medications will be in advance as this is different depending on your insurance coverage. However, we may be able to find a substitute medication at lower cost or fill out paperwork to get insurance to cover a needed medication.   If a prior authorization is required to get your medication covered by your insurance company, please allow us 1-2 business days to complete this process.  Drug prices often vary depending on where the prescription is filled and some pharmacies may offer cheaper prices.  The website www.goodrx.com contains coupons for medications through different pharmacies. The prices here do not account for what the cost may be with help from insurance (it may be cheaper with your insurance), but the website can give you the price if you did not use any insurance.  - You can print the associated coupon and take it with   your prescription to the pharmacy.  - You may also stop by our office during regular business hours and pick up a GoodRx coupon card.  - If you need your prescription sent electronically to a different pharmacy, notify our office through [facility] MyChart or by phone at [phone] option 4.     Si Usted Necesita Algo Despus de Su Visita  Tambin puede enviarnos un mensaje a travs de MyChart. Por lo general respondemos a los mensajes de MyChart en el transcurso de 1 a 2  das hbiles.  Para renovar recetas, por favor pida a su farmacia que se ponga en contacto con nuestra oficina. Nuestro nmero de fax es el [phone].  Si tiene un asunto urgente cuando la clnica est cerrada y que no puede esperar hasta el siguiente da hbil, puede llamar/localizar a su doctor(a) al nmero que aparece a continuacin.   Por favor, tenga en cuenta que aunque hacemos todo lo posible para estar disponibles para asuntos urgentes fuera del horario de oficina, no estamos disponibles las 24 horas del da, los 7 das de la semana.   Si tiene un problema urgente y no puede comunicarse con nosotros, puede optar por buscar atencin mdica  en el consultorio de su doctor(a), en una clnica privada, en un centro de atencin urgente o en una sala de emergencias.  Si tiene una emergencia mdica, por favor llame inmediatamente al 911 o vaya a la sala de emergencias.  Nmeros de bper  - Dr. Kowalski: [phone]  - Dra. Moye: [phone]  - Dra. Stewart: [phone]  En caso de inclemencias del tiempo, por favor llame a nuestra lnea principal al [phone] para una actualizacin sobre el estado de cualquier retraso o cierre.  Consejos para la medicacin en dermatologa: Por favor, guarde las cajas en las que vienen los medicamentos de uso tpico para ayudarle a seguir las instrucciones sobre dnde y cmo usarlos. Las farmacias generalmente imprimen las instrucciones del medicamento slo en las cajas y no directamente en los tubos del medicamento.   Si su medicamento es muy caro, por favor, pngase en contacto con nuestra oficina llamando al [phone] y presione la opcin 4 o envenos un mensaje a travs de MyChart.   No podemos decirle cul ser su copago por los medicamentos por adelantado ya que esto es diferente dependiendo de la cobertura de su seguro. Sin embargo, es posible que podamos encontrar un medicamento sustituto a menor costo o llenar un formulario para que el  seguro cubra el medicamento que se considera necesario.   Si se requiere una autorizacin previa para que su compaa de seguros cubra su medicamento, por favor permtanos de 1 a 2 das hbiles para completar este proceso.  Los precios de los medicamentos varan con frecuencia dependiendo del lugar de dnde se surte la receta y alguna farmacias pueden ofrecer precios ms baratos.  El sitio web www.goodrx.com tiene cupones para medicamentos de diferentes farmacias. Los precios aqu no tienen en cuenta lo que podra costar con la ayuda del seguro (puede ser ms barato con su seguro), pero el sitio web puede darle el precio si no utiliz ningn seguro.  - Puede imprimir el cupn correspondiente y llevarlo con su receta a la farmacia.  - Tambin puede pasar por nuestra oficina durante el horario de atencin regular y recoger una tarjeta de cupones de GoodRx.  - Si necesita que su receta se enve electrnicamente a una farmacia diferente, informe a nuestra oficina a travs de MyChart de [place]   o por telfono llamando al [phone] y presione la opcin 4.  

## 2022-03-18 NOTE — Progress Notes (Signed)
   Follow-Up Visit   Subjective  Krystal Weaver is a 67 y.o. female who presents for the following: Total body skin exam (Hx of Melanoma IS R calf, hx of Dysplastic Nevi R med antecubital, L prox antecubital, L prox post lat thigh). The patient presents for Total-Body Skin Exam (TBSE) for skin cancer screening and mole check.  The patient has spots, moles and lesions to be evaluated, some may be new or changing and the patient has concerns that these could be cancer.   The following portions of the chart were reviewed this encounter and updated as appropriate:   Tobacco  Allergies  Meds  Problems  Med Hx  Surg Hx  Fam Hx     Review of Systems:  No other skin or systemic complaints except as noted in HPI or Assessment and Plan.  Objective  Well appearing patient in no apparent distress; mood and affect are within normal limits.  A full examination was performed including scalp, head, eyes, ears, nose, lips, neck, chest, axillae, abdomen, back, buttocks, bilateral upper extremities, bilateral lower extremities, hands, feet, fingers, toes, fingernails, and toenails. All findings within normal limits unless otherwise noted below.  R calf Well healed scar with no evidence of recurrence, no lymphadenopathy.    Assessment & Plan   Lentigines - Scattered tan macules - Due to sun exposure - Benign-appearing, observe - Recommend daily broad spectrum sunscreen SPF 30+ to sun-exposed areas, reapply every 2 hours as needed. - Call for any changes  Seborrheic Keratoses - Stuck-on, waxy, tan-brown papules and/or plaques  - Benign-appearing - Discussed benign etiology and prognosis. - Observe - Call for any changes  Melanocytic Nevi - Tan-brown and/or pink-flesh-colored symmetric macules and papules - Benign appearing on exam today - Observation - Call clinic for new or changing moles - Recommend daily use of broad spectrum spf 30+ sunscreen to sun-exposed areas.   Hemangiomas -  Red papules - Discussed benign nature - Observe - Call for any changes  Actinic Damage - Chronic condition, secondary to cumulative UV/sun exposure - diffuse scaly erythematous macules with underlying dyspigmentation - Recommend daily broad spectrum sunscreen SPF 30+ to sun-exposed areas, reapply every 2 hours as needed.  - Staying in the shade or wearing long sleeves, sun glasses (UVA+UVB protection) and wide brim hats (4-inch brim around the entire circumference of the hat) are also recommended for sun protection.  - Call for new or changing lesions.  Skin cancer screening performed today.   History of Dysplastic Nevi - No evidence of recurrence today - Recommend regular full body skin exams - Recommend daily broad spectrum sunscreen SPF 30+ to sun-exposed areas, reapply every 2 hours as needed.  - Call if any new or changing lesions are noted between office visits  - R medial antecubital, L prox antecubital, L prox post lat thigh  History of melanoma in situ R calf Excised 11/2017 Clear. Observe for recurrence.  No lymphadenopathy.  Call clinic for new or changing lesions.  Recommend regular skin exams, daily broad-spectrum spf 30+ sunscreen use, and photoprotection.    Return in about 6 months (around 09/18/2022) for TBSE, Hx of Melanoma IS, Hx of Dysplastic nevi.  I, Othelia Pulling, RMA, am acting as scribe for [name], MD . Documentation: I have reviewed the above documentation for accuracy and completeness, and I agree with the above.  [name], MD

## 2022-03-21 ENCOUNTER — Encounter: Payer: Self-pay | Admitting: Dermatology

## 2022-04-03 DIAGNOSIS — G4733 Obstructive sleep apnea (adult) (pediatric): Secondary | ICD-10-CM | POA: Diagnosis not present

## 2022-05-04 DIAGNOSIS — M9904 Segmental and somatic dysfunction of sacral region: Secondary | ICD-10-CM | POA: Diagnosis not present

## 2022-05-04 DIAGNOSIS — M5451 Vertebrogenic low back pain: Secondary | ICD-10-CM | POA: Diagnosis not present

## 2022-05-04 DIAGNOSIS — M9901 Segmental and somatic dysfunction of cervical region: Secondary | ICD-10-CM | POA: Diagnosis not present

## 2022-05-04 DIAGNOSIS — M542 Cervicalgia: Secondary | ICD-10-CM | POA: Diagnosis not present

## 2022-05-04 DIAGNOSIS — M9903 Segmental and somatic dysfunction of lumbar region: Secondary | ICD-10-CM | POA: Diagnosis not present

## 2022-05-04 DIAGNOSIS — M7918 Myalgia, other site: Secondary | ICD-10-CM | POA: Diagnosis not present

## 2022-05-04 DIAGNOSIS — G4733 Obstructive sleep apnea (adult) (pediatric): Secondary | ICD-10-CM | POA: Diagnosis not present

## 2022-05-17 DIAGNOSIS — S39012A Strain of muscle, fascia and tendon of lower back, initial encounter: Secondary | ICD-10-CM | POA: Diagnosis not present

## 2022-05-17 DIAGNOSIS — M62830 Muscle spasm of back: Secondary | ICD-10-CM | POA: Diagnosis not present

## 2022-05-17 DIAGNOSIS — M9902 Segmental and somatic dysfunction of thoracic region: Secondary | ICD-10-CM | POA: Diagnosis not present

## 2022-05-17 DIAGNOSIS — M9903 Segmental and somatic dysfunction of lumbar region: Secondary | ICD-10-CM | POA: Diagnosis not present

## 2022-05-18 DIAGNOSIS — M62830 Muscle spasm of back: Secondary | ICD-10-CM | POA: Diagnosis not present

## 2022-05-18 DIAGNOSIS — M9903 Segmental and somatic dysfunction of lumbar region: Secondary | ICD-10-CM | POA: Diagnosis not present

## 2022-05-18 DIAGNOSIS — M9902 Segmental and somatic dysfunction of thoracic region: Secondary | ICD-10-CM | POA: Diagnosis not present

## 2022-05-18 DIAGNOSIS — S39012A Strain of muscle, fascia and tendon of lower back, initial encounter: Secondary | ICD-10-CM | POA: Diagnosis not present

## 2022-05-24 DIAGNOSIS — M9903 Segmental and somatic dysfunction of lumbar region: Secondary | ICD-10-CM | POA: Diagnosis not present

## 2022-05-24 DIAGNOSIS — S39012A Strain of muscle, fascia and tendon of lower back, initial encounter: Secondary | ICD-10-CM | POA: Diagnosis not present

## 2022-05-24 DIAGNOSIS — M62830 Muscle spasm of back: Secondary | ICD-10-CM | POA: Diagnosis not present

## 2022-05-24 DIAGNOSIS — M9902 Segmental and somatic dysfunction of thoracic region: Secondary | ICD-10-CM | POA: Diagnosis not present

## 2022-05-26 DIAGNOSIS — S39012A Strain of muscle, fascia and tendon of lower back, initial encounter: Secondary | ICD-10-CM | POA: Diagnosis not present

## 2022-05-26 DIAGNOSIS — M9903 Segmental and somatic dysfunction of lumbar region: Secondary | ICD-10-CM | POA: Diagnosis not present

## 2022-05-26 DIAGNOSIS — M9902 Segmental and somatic dysfunction of thoracic region: Secondary | ICD-10-CM | POA: Diagnosis not present

## 2022-05-26 DIAGNOSIS — M62830 Muscle spasm of back: Secondary | ICD-10-CM | POA: Diagnosis not present

## 2022-05-31 DIAGNOSIS — M9903 Segmental and somatic dysfunction of lumbar region: Secondary | ICD-10-CM | POA: Diagnosis not present

## 2022-05-31 DIAGNOSIS — M62830 Muscle spasm of back: Secondary | ICD-10-CM | POA: Diagnosis not present

## 2022-05-31 DIAGNOSIS — S39012A Strain of muscle, fascia and tendon of lower back, initial encounter: Secondary | ICD-10-CM | POA: Diagnosis not present

## 2022-05-31 DIAGNOSIS — M9902 Segmental and somatic dysfunction of thoracic region: Secondary | ICD-10-CM | POA: Diagnosis not present

## 2022-06-02 DIAGNOSIS — M9902 Segmental and somatic dysfunction of thoracic region: Secondary | ICD-10-CM | POA: Diagnosis not present

## 2022-06-02 DIAGNOSIS — S39012A Strain of muscle, fascia and tendon of lower back, initial encounter: Secondary | ICD-10-CM | POA: Diagnosis not present

## 2022-06-02 DIAGNOSIS — M9903 Segmental and somatic dysfunction of lumbar region: Secondary | ICD-10-CM | POA: Diagnosis not present

## 2022-06-02 DIAGNOSIS — M62830 Muscle spasm of back: Secondary | ICD-10-CM | POA: Diagnosis not present

## 2022-06-03 DIAGNOSIS — G4733 Obstructive sleep apnea (adult) (pediatric): Secondary | ICD-10-CM | POA: Diagnosis not present

## 2022-06-11 DIAGNOSIS — G4733 Obstructive sleep apnea (adult) (pediatric): Secondary | ICD-10-CM | POA: Diagnosis not present

## 2022-06-15 DIAGNOSIS — M9902 Segmental and somatic dysfunction of thoracic region: Secondary | ICD-10-CM | POA: Diagnosis not present

## 2022-06-15 DIAGNOSIS — M9903 Segmental and somatic dysfunction of lumbar region: Secondary | ICD-10-CM | POA: Diagnosis not present

## 2022-06-15 DIAGNOSIS — M62830 Muscle spasm of back: Secondary | ICD-10-CM | POA: Diagnosis not present

## 2022-06-15 DIAGNOSIS — S39012A Strain of muscle, fascia and tendon of lower back, initial encounter: Secondary | ICD-10-CM | POA: Diagnosis not present

## 2022-06-22 DIAGNOSIS — M9903 Segmental and somatic dysfunction of lumbar region: Secondary | ICD-10-CM | POA: Diagnosis not present

## 2022-06-22 DIAGNOSIS — M9902 Segmental and somatic dysfunction of thoracic region: Secondary | ICD-10-CM | POA: Diagnosis not present

## 2022-06-22 DIAGNOSIS — M62830 Muscle spasm of back: Secondary | ICD-10-CM | POA: Diagnosis not present

## 2022-06-22 DIAGNOSIS — S39012A Strain of muscle, fascia and tendon of lower back, initial encounter: Secondary | ICD-10-CM | POA: Diagnosis not present

## 2022-07-04 DIAGNOSIS — G4733 Obstructive sleep apnea (adult) (pediatric): Secondary | ICD-10-CM | POA: Diagnosis not present

## 2022-07-05 DIAGNOSIS — M62830 Muscle spasm of back: Secondary | ICD-10-CM | POA: Diagnosis not present

## 2022-07-05 DIAGNOSIS — S39012A Strain of muscle, fascia and tendon of lower back, initial encounter: Secondary | ICD-10-CM | POA: Diagnosis not present

## 2022-07-05 DIAGNOSIS — M9902 Segmental and somatic dysfunction of thoracic region: Secondary | ICD-10-CM | POA: Diagnosis not present

## 2022-07-05 DIAGNOSIS — M9903 Segmental and somatic dysfunction of lumbar region: Secondary | ICD-10-CM | POA: Diagnosis not present

## 2022-07-20 DIAGNOSIS — M1712 Unilateral primary osteoarthritis, left knee: Secondary | ICD-10-CM | POA: Diagnosis not present

## 2022-08-03 DIAGNOSIS — G4733 Obstructive sleep apnea (adult) (pediatric): Secondary | ICD-10-CM | POA: Diagnosis not present

## 2022-09-03 DIAGNOSIS — G4733 Obstructive sleep apnea (adult) (pediatric): Secondary | ICD-10-CM | POA: Diagnosis not present

## 2022-09-10 ENCOUNTER — Telehealth: Payer: Self-pay | Admitting: Internal Medicine

## 2022-09-10 NOTE — Telephone Encounter (Signed)
Copied from CRM [id number]. Topic: Medicare AWV >> Sep 10, 2022 11:09 AM Payton Doughty wrote: Reason for CRM: LM 09/10/2022 to schedule AWV   Verlee Rossetti; Care Guide Ambulatory Clinical Support [place] l [facility] Direct Dial: [phone]

## 2022-09-12 DIAGNOSIS — G4733 Obstructive sleep apnea (adult) (pediatric): Secondary | ICD-10-CM | POA: Diagnosis not present

## 2022-09-13 DIAGNOSIS — R69 Illness, unspecified: Secondary | ICD-10-CM | POA: Diagnosis not present

## 2022-09-20 ENCOUNTER — Encounter: Payer: Self-pay | Admitting: Internal Medicine

## 2022-09-21 ENCOUNTER — Encounter: Payer: Self-pay | Admitting: Dermatology

## 2022-09-21 ENCOUNTER — Ambulatory Visit: Payer: Medicare HMO | Admitting: Dermatology

## 2022-09-21 DIAGNOSIS — D1801 Hemangioma of skin and subcutaneous tissue: Secondary | ICD-10-CM

## 2022-09-21 DIAGNOSIS — D229 Melanocytic nevi, unspecified: Secondary | ICD-10-CM

## 2022-09-21 DIAGNOSIS — Z1283 Encounter for screening for malignant neoplasm of skin: Secondary | ICD-10-CM

## 2022-09-21 DIAGNOSIS — Z86018 Personal history of other benign neoplasm: Secondary | ICD-10-CM | POA: Diagnosis not present

## 2022-09-21 DIAGNOSIS — Z85820 Personal history of malignant melanoma of skin: Secondary | ICD-10-CM

## 2022-09-21 DIAGNOSIS — L578 Other skin changes due to chronic exposure to nonionizing radiation: Secondary | ICD-10-CM | POA: Diagnosis not present

## 2022-09-21 DIAGNOSIS — L814 Other melanin hyperpigmentation: Secondary | ICD-10-CM

## 2022-09-21 DIAGNOSIS — W908XXA Exposure to other nonionizing radiation, initial encounter: Secondary | ICD-10-CM | POA: Diagnosis not present

## 2022-09-21 DIAGNOSIS — L821 Other seborrheic keratosis: Secondary | ICD-10-CM

## 2022-09-21 DIAGNOSIS — Z86006 Personal history of melanoma in-situ: Secondary | ICD-10-CM

## 2022-09-21 NOTE — Telephone Encounter (Signed)
Spoke with pt and she stated that she does not want to schedule an appt at this time. She wanted to wait until after she see the orthopedist on 09/28/2022. Pt has been scheduled for 10/12/2022.

## 2022-09-21 NOTE — Patient Instructions (Signed)

## 2022-09-21 NOTE — Progress Notes (Signed)
   Follow-Up Visit   Subjective  Krystal Weaver is a 67 y.o. female who presents for the following: Skin Cancer Screening and Full Body Skin Exam - History of Melanoma in situ  The patient presents for Total-Body Skin Exam (TBSE) for skin cancer screening and mole check. The patient has spots, moles and lesions to be evaluated, some may be new or changing and the patient may have concern these could be cancer.  The following portions of the chart were reviewed this encounter and updated as appropriate: medications, allergies, medical history  Review of Systems:  No other skin or systemic complaints except as noted in HPI or Assessment and Plan.  Objective  Well appearing patient in no apparent distress; mood and affect are within normal limits.  A full examination was performed including scalp, head, eyes, ears, nose, lips, neck, chest, axillae, abdomen, back, buttocks, bilateral upper extremities, bilateral lower extremities, hands, feet, fingers, toes, fingernails, and toenails. All findings within normal limits unless otherwise noted below.   Relevant physical exam findings are noted in the Assessment and Plan.    Assessment & Plan   SKIN CANCER SCREENING PERFORMED TODAY.  ACTINIC DAMAGE - Chronic condition, secondary to cumulative UV/sun exposure - diffuse scaly erythematous macules with underlying dyspigmentation - Recommend daily broad spectrum sunscreen SPF 30+ to sun-exposed areas, reapply every 2 hours as needed.  - Staying in the shade or wearing long sleeves, sun glasses (UVA+UVB protection) and wide brim hats (4-inch brim around the entire circumference of the hat) are also recommended for sun protection.  - Call for new or changing lesions.  LENTIGINES, SEBORRHEIC KERATOSES, HEMANGIOMAS - Benign normal skin lesions - Benign-appearing - Call for any changes  MELANOCYTIC NEVI - Tan-brown and/or pink-flesh-colored symmetric macules and papules - Benign appearing on  exam today - Observation - Call clinic for new or changing moles - Recommend daily use of broad spectrum spf 30+ sunscreen to sun-exposed areas.   HISTORY OF MELANOMA IN SITU - No evidence of recurrence today - Recommend regular full body skin exams - Recommend daily broad spectrum sunscreen SPF 30+ to sun-exposed areas, reapply every 2 hours as needed.  - Call if any new or changing lesions are noted between office visits  History of Dysplastic Nevi - No evidence of recurrence today - Recommend regular full body skin exams - Recommend daily broad spectrum sunscreen SPF 30+ to sun-exposed areas, reapply every 2 hours as needed.  - Call if any new or changing lesions are noted between office visits  Actinic skin damage  Lentigo  Melanocytic nevus, unspecified location  Skin cancer screening  History of malignant melanoma  Seborrheic keratosis  History of dysplastic nevus   Return in about 1 year (around 09/21/2023) for TBSE.  I, Joanie Coddington, CMA, am acting as scribe for Armida Sans, MD .   Documentation: I have reviewed the above documentation for accuracy and completeness, and I agree with the above.  Armida Sans, MD

## 2022-09-28 DIAGNOSIS — M1712 Unilateral primary osteoarthritis, left knee: Secondary | ICD-10-CM | POA: Diagnosis not present

## 2022-10-04 DIAGNOSIS — G4733 Obstructive sleep apnea (adult) (pediatric): Secondary | ICD-10-CM | POA: Diagnosis not present

## 2022-10-12 ENCOUNTER — Ambulatory Visit: Payer: Medicare HMO | Admitting: Internal Medicine

## 2022-10-12 ENCOUNTER — Ambulatory Visit (INDEPENDENT_AMBULATORY_CARE_PROVIDER_SITE_OTHER): Payer: Medicare HMO | Admitting: Internal Medicine

## 2022-10-12 ENCOUNTER — Encounter: Payer: Self-pay | Admitting: Internal Medicine

## 2022-10-12 VITALS — BP 120/86 | HR 64 | Ht 65.0 in | Wt 141.0 lb

## 2022-10-12 DIAGNOSIS — Z23 Encounter for immunization: Secondary | ICD-10-CM

## 2022-10-12 DIAGNOSIS — C439 Malignant melanoma of skin, unspecified: Secondary | ICD-10-CM | POA: Diagnosis not present

## 2022-10-12 DIAGNOSIS — I471 Supraventricular tachycardia, unspecified: Secondary | ICD-10-CM | POA: Diagnosis not present

## 2022-10-12 DIAGNOSIS — G4733 Obstructive sleep apnea (adult) (pediatric): Secondary | ICD-10-CM | POA: Diagnosis not present

## 2022-10-12 DIAGNOSIS — R928 Other abnormal and inconclusive findings on diagnostic imaging of breast: Secondary | ICD-10-CM

## 2022-10-12 DIAGNOSIS — Z780 Asymptomatic menopausal state: Secondary | ICD-10-CM | POA: Diagnosis not present

## 2022-10-12 DIAGNOSIS — Z01818 Encounter for other preprocedural examination: Secondary | ICD-10-CM | POA: Insufficient documentation

## 2022-10-12 DIAGNOSIS — E782 Mixed hyperlipidemia: Secondary | ICD-10-CM

## 2022-10-12 DIAGNOSIS — M8580 Other specified disorders of bone density and structure, unspecified site: Secondary | ICD-10-CM | POA: Diagnosis not present

## 2022-10-12 DIAGNOSIS — D126 Benign neoplasm of colon, unspecified: Secondary | ICD-10-CM

## 2022-10-12 LAB — COMPREHENSIVE METABOLIC PANEL
ALT: 15 U/L (ref 0–35)
AST: 21 U/L (ref 0–37)
Albumin: 4.6 g/dL (ref 3.5–5.2)
Alkaline Phosphatase: 53 U/L (ref 39–117)
BUN: 13 mg/dL (ref 6–23)
CO2: 29 meq/L (ref 19–32)
Calcium: 9.8 mg/dL (ref 8.4–10.5)
Chloride: 96 meq/L (ref 96–112)
Creatinine, Ser: 0.83 mg/dL (ref 0.40–1.20)
GFR: 72.71 mL/min (ref >60.00)
Glucose, Bld: 93 mg/dL (ref 70–99)
Potassium: 4.2 meq/L (ref 3.5–5.1)
Sodium: 132 meq/L — ABNORMAL LOW (ref 135–145)
Total Bilirubin: 0.8 mg/dL (ref 0.2–1.2)
Total Protein: 6.5 g/dL (ref 6.0–8.3)

## 2022-10-12 LAB — LIPID PANEL
Cholesterol: 251 mg/dL — ABNORMAL HIGH (ref 0–200)
HDL: 84.1 mg/dL (ref >39.00)
LDL Cholesterol: 147 mg/dL — ABNORMAL HIGH (ref 0–99)
NonHDL: 166.71
Total CHOL/HDL Ratio: 3
Triglycerides: 97 mg/dL (ref 0.0–149.0)
VLDL: 19.4 mg/dL (ref 0.0–40.0)

## 2022-10-12 LAB — CBC WITH DIFFERENTIAL/PLATELET
Basophils Absolute: 0 10*3/uL (ref 0.0–0.1)
Basophils Relative: 0.6 % (ref 0.0–3.0)
Eosinophils Absolute: 0.1 10*3/uL (ref 0.0–0.7)
Eosinophils Relative: 0.8 % (ref 0.0–5.0)
HCT: 42.3 % (ref 36.0–46.0)
Hemoglobin: 13.8 g/dL (ref 12.0–15.0)
Lymphocytes Relative: 29.7 % (ref 12.0–46.0)
Lymphs Abs: 2 10*3/uL (ref 0.7–4.0)
MCHC: 32.7 g/dL (ref 30.0–36.0)
MCV: 90.2 fL (ref 78.0–100.0)
Monocytes Absolute: 0.4 10*3/uL (ref 0.1–1.0)
Monocytes Relative: 5.6 % (ref 3.0–12.0)
Neutro Abs: 4.2 10*3/uL (ref 1.4–7.7)
Neutrophils Relative %: 63.3 % (ref 43.0–77.0)
Platelets: 298 10*3/uL (ref 150.0–400.0)
RBC: 4.69 Mil/uL (ref 3.87–5.11)
RDW: 13.2 % (ref 11.5–15.5)
WBC: 6.7 10*3/uL (ref 4.0–10.5)

## 2022-10-12 LAB — HEMOGLOBIN A1C: Hgb A1c MFr Bld: 5.8 % (ref 4.6–6.5)

## 2022-10-12 LAB — LDL CHOLESTEROL, DIRECT: Direct LDL: 150 mg/dL

## 2022-10-12 NOTE — Progress Notes (Signed)
Subjective:  Patient ID: Krystal Weaver, female    DOB: 27-Oct-1955  Age: 67 y.o. MRN: [id number]  CC: The primary encounter diagnosis was Mixed hyperlipidemia. Diagnoses of Pre-operative clearance, Abnormal screening mammogram, Paroxysmal SVT (supraventricular tachycardia) (HCC), Preoperative clearance, OSA on CPAP, Tubular adenoma of colon, Melanoma of skin (HCC), and Osteopenia after menopause were also pertinent to this visit.   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  She has no history of  DM Type 2 , CKD ,  or  PAD. Her severe DJD has limited her tolerance for pickle ball and walking ,  but still swimming 3 times per week. . Using meloxicam daily,    She was diagnosed iwht mild OSA in Feb 2024 by home sleep study (2 night) during workup for PSVT .  She is using CPAP every night and sleeping much better  She has bipolar disorder which has been stable for years with lamotrigine . She has infrequent occurrences of PSVT.  Most are triggered by  dehdyration /exercise, and resolve with home maneuvers and prn metoprolol.  She had one  episode of PSVT requring conversion in the ER in the last year.   She has no histor y of asthma ,  COPD, or tobacco use.   Outpatient Medications Prior to Visit  Medication Sig Dispense Refill   ascorbic acid (VITAMIN C) 500 MG tablet Take by mouth.     Cholecalciferol (VITAMIN D3) 1000 UNITS CAPS Take 1 capsule by mouth 2 (two) times daily.     fish oil-omega-3 fatty acids 1000 MG capsule Take 1,200 g by mouth daily.     lamoTRIgine (LAMICTAL) 100 MG tablet Take 100 mg by mouth daily.     Magnesium 400 MG TABS Take by mouth.     meloxicam (MOBIC) 7.5 MG tablet Take 7.5 mg  by mouth daily.     Multiple Vitamins-Minerals (MULTIVITAMIN WITH MINERALS) tablet Take 1 tablet by mouth daily.     Probiotic Product (PROBIOTIC PO) Take 1 capsule by mouth daily.     No facility-administered medications prior to visit.    Review of Systems;  Patient denies headache, fevers, malaise, unintentional weight loss, skin rash, eye pain, sinus congestion and sinus pain, sore throat, dysphagia,  hemoptysis , cough, dyspnea, wheezing, chest pain, palpitations, orthopnea, edema, abdominal pain, nausea, melena, diarrhea, constipation, flank pain, dysuria, hematuria, urinary  Frequency, nocturia, numbness, tingling, seizures,  Focal weakness, Loss of consciousness,  Tremor, insomnia, depression, anxiety, and suicidal ideation.      Objective:  BP 120/86   Pulse 64   Ht 5\' 5"  (1.651 m)   Wt 141 lb (64 kg)   SpO2 98%   BMI 23.46 kg/m   BP Readings from Last 3 Encounters:  10/12/22 120/86  03/18/22 (!) 141/74  12/28/21 108/64    Wt Readings from Last 3 Encounters:  10/12/22 141 lb (64 kg)  12/28/21 136 lb 6.4 oz (61.9 kg)  09/17/21 143 lb (64.9 kg)    Physical Exam Vitals reviewed.  Constitutional:      General: She is not in acute distress.    Appearance: Normal appearance. She is normal weight. She  is not ill-appearing, toxic-appearing or diaphoretic.  HENT:     Head: Normocephalic.  Eyes:     General: No scleral icterus.       Right eye: No discharge.        Left eye: No discharge.     Conjunctiva/sclera: Conjunctivae normal.  Cardiovascular:     Rate and Rhythm: Normal rate and regular rhythm.     Heart sounds: Normal heart sounds.  Pulmonary:     Effort: Pulmonary effort is normal. No respiratory distress.     Breath sounds: Normal breath sounds.  Musculoskeletal:        General: Normal range of motion.  Skin:    General: Skin is warm and dry.  Neurological:     General: No focal deficit present.     Mental Status: She is alert and oriented to person,  place, and time. Mental status is at baseline.  Psychiatric:        Mood and Affect: Mood normal.        Behavior: Behavior normal.        Thought Content: Thought content normal.        Judgment: Judgment normal.    Lab Results  Component Value Date   HGBA1C 5.8 10/12/2022   HGBA1C 5.7 12/28/2021   HGBA1C 5.8 01/09/2021    Lab Results  Component Value Date   CREATININE 0.83 10/12/2022   CREATININE 0.84 12/28/2021   CREATININE 0.87 01/09/2021    Lab Results  Component Value Date   WBC 6.7 10/12/2022   HGB 13.8 10/12/2022   HCT 42.3 10/12/2022   PLT 298.0 10/12/2022   GLUCOSE 93 10/12/2022   CHOL 251 (H) 10/12/2022   TRIG 97.0 10/12/2022   HDL 84.10 10/12/2022   LDLDIRECT 150.0 10/12/2022   LDLCALC 147 (H) 10/12/2022   ALT 15 10/12/2022   AST 21 10/12/2022   NA 132 (L) 10/12/2022   K 4.2 10/12/2022   CL 96 10/12/2022   CREATININE 0.83 10/12/2022   BUN 13 10/12/2022   CO2 29 10/12/2022   TSH 0.79 12/28/2021   HGBA1C 5.8 10/12/2022   MICROALBUR <0.7 12/28/2021    MM DIAG BREAST TOMO UNI RIGHT  Result Date: 01/19/2022 CLINICAL DATA:  Callback for possible RIGHT breast distortion seen on MLO view only EXAM: DIGITAL DIAGNOSTIC UNILATERAL RIGHT MAMMOGRAM WITH TOMOSYNTHESIS; ULTRASOUND RIGHT BREAST LIMITED TECHNIQUE: Right digital diagnostic mammography and breast tomosynthesis was performed.; Targeted ultrasound examination of the right breast was performed COMPARISON:  Previous exam(s). ACR Breast Density Category c: The breast tissue is heterogeneously dense, which may obscure small masses. FINDINGS: The previously described finding does not persist with additional views, consistent with superimposed fibroglandular tissue. Fibroglandular tissue assumes a configuration stable in comparison to remote prior mammograms. No suspicious mass, microcalcification, or other finding is identified. Due to breast density, targeted ultrasound was performed. Targeted ultrasound was  performed of the RIGHT upper outer breast. No suspicious cystic or solid mass is seen at the site of screening mammographic concern. IMPRESSION: No mammographic or sonographic evidence of malignancy at the site of screening mammographic concern. RECOMMENDATION: Screening mammogram in one year.(Code:SM-B-01Y) I have discussed the findings and recommendations with the patient. If applicable, a reminder letter will be sent to the patient regarding the next appointment. BI-RADS CATEGORY  1: Negative. Electronically Signed   By: Meda Klinefelter M.D.   On: 01/19/2022 10:25   US BREAST LTD UNI RIGHT INC AXILLA  Result Date: 01/19/2022 CLINICAL DATA:  Callback for possible  RIGHT breast distortion seen on MLO view only EXAM: DIGITAL DIAGNOSTIC UNILATERAL RIGHT MAMMOGRAM WITH TOMOSYNTHESIS; ULTRASOUND RIGHT BREAST LIMITED TECHNIQUE: Right digital diagnostic mammography and breast tomosynthesis was performed.; Targeted ultrasound examination of the right breast was performed COMPARISON:  Previous exam(s). ACR Breast Density Category c: The breast tissue is heterogeneously dense, which may obscure small masses. FINDINGS: The previously described finding does not persist with additional views, consistent with superimposed fibroglandular tissue. Fibroglandular tissue assumes a configuration stable in comparison to remote prior mammograms. No suspicious mass, microcalcification, or other finding is identified. Due to breast density, targeted ultrasound was performed. Targeted ultrasound was performed of the RIGHT upper outer breast. No suspicious cystic or solid mass is seen at the site of screening mammographic concern. IMPRESSION: No mammographic or sonographic evidence of malignancy at the site of screening mammographic concern. RECOMMENDATION: Screening mammogram in one year.(Code:SM-B-01Y) I have discussed the findings and recommendations with the patient. If applicable, a reminder letter will be sent to the patient  regarding the next appointment. BI-RADS CATEGORY  1: Negative. Electronically Signed   By: Meda Klinefelter M.D.   On: 01/19/2022 10:25   Assessment & Plan:  .Mixed hyperlipidemia -     Lipid panel -     LDL cholesterol, direct  Pre-operative clearance -     EKG 12-Lead -     Comprehensive metabolic panel -     CBC with Differential/Platelet -     Hemoglobin A1c -     Urinalysis, Routine w reflex microscopic; Future  Abnormal screening mammogram Assessment & Plan: Diagnostic mammogram and U/S of right breast wree normal in Jan 2024    Paroxysmal SVT (supraventricular tachycardia) (HCC) Assessment & Plan: Infrequent , managed with home maneuvers,  prn metoprolol.  One episodee requiring cardioversion . Limits caffeine to one coffee daily    Preoperative clearance Assessment & Plan: Patient  is  presumed to be at low risk  For perioperative complications  Based on today's exam and history.  I have ordered and reviewed a 12 lead EKG and find that there are no acute changes and patient is in sinus rhythm.   Baseline lytes,  hgb and A1c have been ordered.  She is advised to remind anesthesiology  of her history of PSVT and OSA    OSA on CPAP Assessment & Plan: Diagnosed by sleep study in Feb 2024  during PSVT workup.  . She is wearing her CPAP every night a minimum of 6 hours per night and notes improved daytime wakefulness and decreased fatigue    Orders: -     Pneumococcal conjugate vaccine 20-valent  Tubular adenoma of colon Assessment & Plan: Found in 2018.  Repeat exam Feb 2023 was negatie Orthoptist) and he advised no further screening given her age. She is not comfortable with abandonging screening so we will use cologuard in 2033.    Melanoma of skin [facility]) Assessment & Plan: She has had no recurrence since her excision Nov 2019 and is now on annual surveillance.    Osteopenia after menopause Assessment & Plan: T score -1.6 by DEXA Jan 2024 repeat in 2  years      I provided 30 minutes of face-to-face time during this encounter reviewing patient's last visit with me, patient's  most recent visit with Orthopedics , recent surgical and non surgical procedures, previous  labs and imaging studies, counseling on currently addressed issues,  and post visit ordering to diagnostics and therapeutics .   Follow-up: No follow-ups on  file.   Sherlene Shams, MD

## 2022-10-12 NOTE — Assessment & Plan Note (Addendum)
Diagnosed by sleep study in Feb 2024  during PSVT workup.  . She is wearing her CPAP every night a minimum of 6 hours per night and notes improved daytime wakefulness and decreased fatigue

## 2022-10-12 NOTE — Patient Instructions (Addendum)
THANK YOU FOR GETTING THE PNEUMONIA VACCINE!   WHEN DR HOOTEN TELLS YOU TO SUSPEND THE MELOXICAm(OR EVEN NOW IF YOU NEED additional pain management :) You can add up to 2000 mg of acetominophen (tylenol) every day safely  In divided doses (500 mg every 6 hours  Or 1000 mg every 12 hours.)   INFORM THE ANESTHESIOLOGIST OF YOUR SLEEP APNEA AND YOUR PSVT

## 2022-10-12 NOTE — Assessment & Plan Note (Signed)
Found in 2018.  Repeat exam Feb 2023 was negatie Orthoptist) and he advised no further screening given her age. She is not comfortable with abandonging screening so we will use cologuard in 2033.

## 2022-10-12 NOTE — Assessment & Plan Note (Signed)
She has had no recurrence since her excision Nov 2019 and is now on annual surveillance.

## 2022-10-12 NOTE — Assessment & Plan Note (Signed)
Diagnostic mammogram and U/S of right breast wree normal in Jan 2024

## 2022-10-12 NOTE — Assessment & Plan Note (Signed)
T score -1.6 by DEXA Jan 2024 repeat in 2 years

## 2022-10-12 NOTE — Assessment & Plan Note (Addendum)
Patient  is  presumed to be at low risk  For perioperative complications  Based on today's exam and history.  I have ordered and reviewed a 12 lead EKG and find that there are no acute changes and patient is in sinus rhythm.   Baseline lytes,  hgb and A1c have been ordered.  She is advised to remind anesthesiology  of her history of PSVT and OSA

## 2022-10-12 NOTE — Assessment & Plan Note (Addendum)
Infrequent , managed with home maneuvers,  prn metoprolol.  One episodee requiring cardioversion . Limits caffeine to one coffee daily

## 2022-10-13 ENCOUNTER — Encounter: Payer: Self-pay | Admitting: *Deleted

## 2022-10-13 DIAGNOSIS — Z01818 Encounter for other preprocedural examination: Secondary | ICD-10-CM | POA: Insufficient documentation

## 2022-10-13 DIAGNOSIS — Z0001 Encounter for general adult medical examination with abnormal findings: Secondary | ICD-10-CM | POA: Insufficient documentation

## 2022-10-13 NOTE — Progress Notes (Addendum)
Patient ID: Krystal Weaver, female    DOB: 1955/12/27  Age: 67 y.o. MRN: [id number]  The patient is here for preoperative clearance and management of other chronic and acute problems.   The risk factors are reflected in the social history.   The roster of all physicians providing medical care to patient - is listed in the Snapshot section of the chart.   Activities of daily living:  The patient is 100% independent in all ADLs: dressing, toileting, feeding as well as independent mobility   Home safety : The patient has smoke detectors in the home. They wear seatbelts.  There are no unsecured firearms at home. There is no violence in the home.    There is no risks for hepatitis, STDs or HIV. There is no   history of blood transfusion. They have no travel history to infectious disease endemic areas of the world.   The patient has seen their dentist in the last six month. They have seen their eye doctor in the last year. The patinet  denies slight hearing difficulty with regard to whispered voices and some television programs.  They have deferred audiologic testing in the last year.  They do not  have excessive sun exposure. Discussed the need for sun protection: hats, long sleeves and use of sunscreen if there is significant sun exposure.    Diet: the importance of a healthy diet is discussed. They do have a healthy diet.   The benefits of regular aerobic exercise were discussed. The patient  exercises  3 to 5 days per week  for  60 minutes.    Depression screen: there are no signs or vegative symptoms of depression- irritability, change in appetite, anhedonia, sadness/tearfullness.   The following portions of the patient's history were reviewed and updated as appropriate: allergies, current medications, past family history, past medical history,  past surgical history, past social history  and problem list.   Visual acuity was not assessed per patient preference since the patient has regular  follow up with an  ophthalmologist. Hearing and body mass index were assessed and reviewed.    During the course of the visit the patient was educated and counseled about appropriate screening and preventive services including : fall prevention , diabetes screening, nutrition counseling, colorectal cancer screening, and recommended immunizations.    Chief Complaint:  Preoperative medical clearance, requested by her orthopedist, for future left knee replacement She has n history of CAD, DM  or CKD.  She denies any  recent episodes of chest pain , cough or dyspnea.   Review of Symptoms  Patient denies headache, fevers, malaise, unintentional weight loss, skin rash, eye pain, sinus congestion and sinus pain, sore throat, dysphagia,  hemoptysis , cough, dyspnea, wheezing, chest pain, palpitations, orthopnea, edema, abdominal pain, nausea, melena, diarrhea, constipation, flank pain, dysuria, hematuria, urinary  Frequency, nocturia, numbness, tingling, seizures,  Focal weakness, Loss of consciousness,  Tremor, insomnia, depression, anxiety, and suicidal ideation.    Physical Exam:  BP 120/86   Pulse 64   Ht 5\' 5"  (1.651 m)   Wt 141 lb (64 kg)   SpO2 98%   BMI 23.46 kg/m    Physical Exam Vitals reviewed.  Constitutional:      General: She is not in acute distress.    Appearance: Normal appearance. She is normal weight. She is not ill-appearing, toxic-appearing or diaphoretic.  HENT:     Head: Normocephalic.  Eyes:     General: No scleral icterus.  Right eye: No discharge.        Left eye: No discharge.     Conjunctiva/sclera: Conjunctivae normal.  Cardiovascular:     Rate and Rhythm: Normal rate and regular rhythm.     Heart sounds: Normal heart sounds.  Pulmonary:     Effort: Pulmonary effort is normal. No respiratory distress.     Breath sounds: Normal breath sounds.  Musculoskeletal:        General: Normal range of motion.  Skin:    General: Skin is warm and dry.   Neurological:     General: No focal deficit present.     Mental Status: She is alert and oriented to person, place, and time. Mental status is at baseline.  Psychiatric:        Mood and Affect: Mood normal.        Behavior: Behavior normal.        Thought Content: Thought content normal.        Judgment: Judgment normal.   Assessment and Plan: Preoperative evaluation to rule out surgical contraindication Assessment & Plan: I have ordered and reviewed a 12 lead EKG and find that there are no acute changes and patient is in sinus rhythm.   Patient  is considered to be at low risk  For perioperative complications  Based on today's exam ,  history.  Evaluation of renal thyroid and hematopoetic  function.    Orders: -     EKG 12-Lead -     Comprehensive metabolic panel -     CBC with Differential/Platelet -     Hemoglobin A1c -     Urinalysis, Routine w reflex microscopic; Future  Preoperative clearance Assessment & Plan: Patient  is  presumed to be at low risk  For perioperative complications  Based on today's exam and history.  I have ordered and reviewed a 12 lead EKG and find that there are no acute changes and patient is in sinus rhythm.   Baseline lytes,  hgb and A1c have been ordered.  She is advised to remind anesthesiology  of her history of PSVT and OSA    Mixed hyperlipidemia -     Lipid panel -     LDL cholesterol, direct  Pre-operative clearance  Abnormal screening mammogram Assessment & Plan: Diagnostic mammogram and U/S of right breast wree normal in Jan 2024    Paroxysmal SVT (supraventricular tachycardia) (HCC) Assessment & Plan: Infrequent , managed with home maneuvers,  prn metoprolol.  One episodee requiring cardioversion . Limits caffeine to one coffee daily    OSA on CPAP Assessment & Plan: Diagnosed by sleep study in Feb 2024  during PSVT workup.  . She is wearing her CPAP every night a minimum of 6 hours per night and notes improved daytime wakefulness  and decreased fatigue    Orders: -     Pneumococcal conjugate vaccine 20-valent  Tubular adenoma of colon Assessment & Plan: Found in 2018.  Repeat exam Feb 2023 was negatie Orthoptist) and he advised no further screening given her age. She is not comfortable with abandonging screening so we will use cologuard in 2033.    Melanoma of skin [facility]) Assessment & Plan: She has had no recurrence since her excision Nov 2019 and is now on annual surveillance.    Osteopenia after menopause Assessment & Plan: T score -1.6 by DEXA Jan 2024 repeat in 2 years     No follow-ups on file.  Sherlene Shams, MD

## 2022-10-13 NOTE — Assessment & Plan Note (Signed)
I have ordered and reviewed a 12 lead EKG and find that there are no acute changes and patient is in sinus rhythm.   Patient  is considered to be at low risk  For perioperative complications  Based on today's exam ,  history.  Evaluation of renal thyroid and hematopoetic  function.

## 2022-10-25 DIAGNOSIS — F3181 Bipolar II disorder: Secondary | ICD-10-CM | POA: Diagnosis not present

## 2022-11-03 DIAGNOSIS — G4733 Obstructive sleep apnea (adult) (pediatric): Secondary | ICD-10-CM | POA: Diagnosis not present

## 2022-11-04 DIAGNOSIS — F3181 Bipolar II disorder: Secondary | ICD-10-CM | POA: Diagnosis not present

## 2022-11-11 DIAGNOSIS — H524 Presbyopia: Secondary | ICD-10-CM | POA: Diagnosis not present

## 2022-11-11 DIAGNOSIS — Z0100 Encounter for examination of eyes and vision without abnormal findings: Secondary | ICD-10-CM | POA: Diagnosis not present

## 2022-11-15 ENCOUNTER — Encounter: Payer: Self-pay | Admitting: Internal Medicine

## 2022-11-15 NOTE — Telephone Encounter (Signed)
Not in pt's current medication list.  

## 2022-11-16 MED ORDER — METOPROLOL TARTRATE 25 MG PO TABS: 25.0000 mg | ORAL_TABLET | Freq: Two times a day (BID) | ORAL | 2 refills | Status: DC

## 2022-11-24 ENCOUNTER — Other Ambulatory Visit: Payer: Self-pay | Admitting: Internal Medicine

## 2022-12-02 DIAGNOSIS — F3181 Bipolar II disorder: Secondary | ICD-10-CM | POA: Diagnosis not present

## 2022-12-04 DIAGNOSIS — G4733 Obstructive sleep apnea (adult) (pediatric): Secondary | ICD-10-CM | POA: Diagnosis not present

## 2022-12-14 DIAGNOSIS — G4733 Obstructive sleep apnea (adult) (pediatric): Secondary | ICD-10-CM | POA: Diagnosis not present

## 2022-12-27 DIAGNOSIS — F3181 Bipolar II disorder: Secondary | ICD-10-CM | POA: Diagnosis not present

## 2022-12-29 ENCOUNTER — Ambulatory Visit: Payer: Medicare HMO | Admitting: Internal Medicine

## 2022-12-29 DIAGNOSIS — M8580 Other specified disorders of bone density and structure, unspecified site: Secondary | ICD-10-CM | POA: Diagnosis not present

## 2022-12-29 DIAGNOSIS — M545 Low back pain, unspecified: Secondary | ICD-10-CM | POA: Diagnosis not present

## 2022-12-29 DIAGNOSIS — Z791 Long term (current) use of non-steroidal anti-inflammatories (NSAID): Secondary | ICD-10-CM | POA: Diagnosis not present

## 2022-12-29 DIAGNOSIS — F319 Bipolar disorder, unspecified: Secondary | ICD-10-CM | POA: Diagnosis not present

## 2022-12-29 DIAGNOSIS — Z8249 Family history of ischemic heart disease and other diseases of the circulatory system: Secondary | ICD-10-CM | POA: Diagnosis not present

## 2022-12-29 DIAGNOSIS — I10 Essential (primary) hypertension: Secondary | ICD-10-CM | POA: Diagnosis not present

## 2022-12-29 DIAGNOSIS — E785 Hyperlipidemia, unspecified: Secondary | ICD-10-CM | POA: Diagnosis not present

## 2022-12-29 DIAGNOSIS — M1990 Unspecified osteoarthritis, unspecified site: Secondary | ICD-10-CM | POA: Diagnosis not present

## 2022-12-29 DIAGNOSIS — I471 Supraventricular tachycardia, unspecified: Secondary | ICD-10-CM | POA: Diagnosis not present

## 2022-12-29 DIAGNOSIS — G4733 Obstructive sleep apnea (adult) (pediatric): Secondary | ICD-10-CM | POA: Diagnosis not present

## 2022-12-29 DIAGNOSIS — Z809 Family history of malignant neoplasm, unspecified: Secondary | ICD-10-CM | POA: Diagnosis not present

## 2023-01-03 DIAGNOSIS — G4733 Obstructive sleep apnea (adult) (pediatric): Secondary | ICD-10-CM | POA: Diagnosis not present

## 2023-01-12 DIAGNOSIS — M1712 Unilateral primary osteoarthritis, left knee: Secondary | ICD-10-CM

## 2023-01-12 HISTORY — DX: Unilateral primary osteoarthritis, left knee: M17.12

## 2023-01-31 DIAGNOSIS — F3181 Bipolar II disorder: Secondary | ICD-10-CM | POA: Diagnosis not present

## 2023-02-03 DIAGNOSIS — G4733 Obstructive sleep apnea (adult) (pediatric): Secondary | ICD-10-CM | POA: Diagnosis not present

## 2023-02-04 ENCOUNTER — Other Ambulatory Visit: Payer: Self-pay | Admitting: Internal Medicine

## 2023-02-04 DIAGNOSIS — Z1231 Encounter for screening mammogram for malignant neoplasm of breast: Secondary | ICD-10-CM

## 2023-02-05 NOTE — Discharge Instructions (Addendum)
Instructions after Total Knee Replacement   [name] Angie Fava., M.D.    Dept. of Orthopaedics & Sports Medicine [facility] [address] [place], [place]  [address]  Phone: [phone]   Fax: [phone]       [email or web address]       DIET: Drink plenty of non-alcoholic fluids. Resume your normal diet. Include foods high in fiber.  ACTIVITY:  You may use crutches or a walker with weight-bearing as tolerated, unless instructed otherwise. You may be weaned off of the walker or crutches by your Physical Therapist.  Do NOT place pillows under the knee. Anything placed under the knee could limit your ability to straighten the knee.   Continue doing gentle exercises. Exercising will reduce the pain and swelling, increase motion, and prevent muscle weakness.   Please continue to use the TED compression stockings for 6 weeks. You may remove the stockings at night, but should reapply them in the morning. Do not drive or operate any equipment until instructed.  WOUND CARE:  Continue to use the PolarCare or ice packs periodically to reduce pain and swelling. You may bathe or shower after the staples are removed at the first office visit following surgery.  MEDICATIONS: You may resume your regular medications. Please take the pain medication as prescribed on the medication. Do not take pain medication on an empty stomach. You have been given a prescription for a blood thinner (Lovenox or Coumadin). Please take the medication as instructed. (NOTE: After completing a 2 week course of Lovenox, take one Enteric-coated aspirin once a day. This along with elevation will help reduce the possibility of phlebitis in your operated leg.) Do not drive or drink alcoholic beverages when taking pain medications.  CALL THE OFFICE FOR: Temperature above 101 degrees Excessive bleeding or drainage on the dressing. Excessive swelling, coldness, or paleness of the toes. Persistent nausea and  vomiting.  FOLLOW-UP:  You should have an appointment to return to the office in 10-14 days after surgery. Arrangements have been made for continuation of Physical Therapy (either home therapy or outpatient therapy).     [facility] Department Directory         [email or web address]       [email or web address]          Cardiology  Appointments: [place] - [phone]  Endocrinology  Appointments: [place] [phone] [place] - [phone]  Gastroenterology  Appointments: [place] [phone] [place] - [phone]        General Surgery   Appointments: [facility]  Internal Medicine/Family Medicine  Appointments: [facility] [place] - [phone] [place] - [phone]  Metabolic and Weigh Loss Surgery  Appointments: [facility]        Neurology  Appointments: [place] [phone] [place] - [phone]  Neurosurgery  Appointments: [place]  Obstetrics & Gynecology  Appointments: [place] [phone] [place] - [phone]        Pediatrics  Appointments: [place] [phone] [place] - [phone]  Physiatry  Appointments: [place] [phone]  Physical Therapy  Appointments: [place] - [phone]        Podiatry  Appointments: [place] [phone] [place] - [phone]  Pulmonology  Appointments: [place]  Rheumatology  Appointments: [place] [phone]        [place] Location: [facility]  [address] [place], [place]  [address]  [place] Location: [facility] [address] [place], [place]  [address]  [place] Location: [facility] [address]  [address] [place], [place]  [address]      [employer]  [address] , [place], [place], [address]. [phone] They will call you to arrange when they can come to see you

## 2023-02-07 ENCOUNTER — Encounter
Admission: RE | Admit: 2023-02-07 | Discharge: 2023-02-07 | Disposition: A | Discharge status: Home / Self Care | Payer: Medicare HMO | Source: Ambulatory Visit | Attending: Orthopedic Surgery | Admitting: Orthopedic Surgery

## 2023-02-07 ENCOUNTER — Ambulatory Visit
Admission: RE | Admit: 2023-02-07 | Discharge: 2023-02-07 | Disposition: A | Discharge status: Home / Self Care | Payer: Medicare HMO | Source: Ambulatory Visit | Attending: Internal Medicine | Admitting: Internal Medicine

## 2023-02-07 ENCOUNTER — Other Ambulatory Visit: Payer: Self-pay

## 2023-02-07 VITALS — BP 137/68 | HR 65 | Temp 97.8°F | Resp 12 | Ht 65.0 in | Wt 138.0 lb

## 2023-02-07 DIAGNOSIS — Z1231 Encounter for screening mammogram for malignant neoplasm of breast: Secondary | ICD-10-CM | POA: Diagnosis not present

## 2023-02-07 DIAGNOSIS — E039 Hypothyroidism, unspecified: Secondary | ICD-10-CM

## 2023-02-07 DIAGNOSIS — Z01812 Encounter for preprocedural laboratory examination: Secondary | ICD-10-CM | POA: Diagnosis present

## 2023-02-07 DIAGNOSIS — M1712 Unilateral primary osteoarthritis, left knee: Secondary | ICD-10-CM | POA: Insufficient documentation

## 2023-02-07 HISTORY — DX: Varicose veins of bilateral lower extremities with pain: I83.813

## 2023-02-07 HISTORY — DX: Benign neoplasm of colon, unspecified: D12.6

## 2023-02-07 HISTORY — DX: Other specified disorders of bone density and structure, unspecified site: M85.80

## 2023-02-07 HISTORY — DX: Obstructive sleep apnea (adult) (pediatric): G47.33

## 2023-02-07 HISTORY — DX: Mixed hyperlipidemia: E78.2

## 2023-02-07 HISTORY — DX: Asymptomatic menopausal state: Z78.0

## 2023-02-07 HISTORY — DX: Supraventricular tachycardia, unspecified: I47.10

## 2023-02-07 HISTORY — DX: Venous insufficiency (chronic) (peripheral): I87.2

## 2023-02-07 LAB — SEDIMENTATION RATE: Sed Rate: 4 mm/h (ref 0–30)

## 2023-02-07 LAB — URINALYSIS, ROUTINE W REFLEX MICROSCOPIC
Bacteria, UA: NONE SEEN
Bilirubin Urine: NEGATIVE
Glucose, UA: NEGATIVE mg/dL
Hgb urine dipstick: NEGATIVE
Ketones, ur: NEGATIVE mg/dL
Nitrite: NEGATIVE
Protein, ur: NEGATIVE mg/dL
Specific Gravity, Urine: 1.011 (ref 1.005–1.030)
pH: 7 (ref 5.0–8.0)

## 2023-02-07 LAB — SURGICAL PCR SCREEN
MRSA, PCR: NEGATIVE
Staphylococcus aureus: NEGATIVE

## 2023-02-07 LAB — C-REACTIVE PROTEIN: CRP: 0.5 mg/dL (ref <1.0)

## 2023-02-07 LAB — TSH: TSH: 1.183 u[IU]/mL (ref 0.350–4.500)

## 2023-02-07 NOTE — Patient Instructions (Signed)
Your procedure is scheduled on: Monday, February 3 Report to the Registration Desk on the 1st floor of the [facility] To find out your arrival time, please call [phone] between 1PM - 3PM on: Friday, January 31 If your arrival time is 6:00 am, do not arrive before that time as the [facility] entrance doors do not open until 6:00 am.  REMEMBER: Instructions that are not followed completely may result in serious medical risk, up to and including death; or upon the discretion of your surgeon and anesthesiologist your surgery may need to be rescheduled.  Do not eat food after midnight the night before surgery.  No gum chewing or hard candies.  You may however, drink CLEAR liquids up to 2 hours before you are scheduled to arrive for your surgery. Do not drink anything within 2 hours of your scheduled arrival time.  Clear liquids include: - water  - apple juice without pulp - gatorade (not RED colors) - black coffee or tea (Do NOT add milk or creamers to the coffee or tea) Do NOT drink anything that is not on this list.  In addition, your doctor has ordered for you to drink the provided:  Ensure Pre-Surgery Clear Carbohydrate Drink  Drinking this carbohydrate drink up to two hours before surgery helps to reduce insulin resistance and improve patient outcomes. Please complete drinking 2 hours before scheduled arrival time.  One week prior to surgery: starting January 27 Stop meloxicam and Anti-inflammatories (NSAIDS) such as Advil, Aleve, Ibuprofen, Motrin, Naproxen, Naprosyn and Aspirin based products such as Excedrin, Goody's Powder, BC Powder. Stop ANY OVER THE COUNTER supplements until after surgery. Stop vitamin C, Vitamin D, fish oil, magnesium, multiple vitamins, probiotic.  You may however, continue to take Tylenol if needed for pain up until the day of surgery.  Continue taking all of your other prescription medications up until the day of surgery.  ON THE DAY OF SURGERY  ONLY TAKE THESE MEDICATIONS WITH SIPS OF WATER:  lamoTRIgine (LAMICTAL)   No Alcohol for 24 hours before or after surgery.  No Smoking including e-cigarettes for 24 hours before surgery.  No chewable tobacco products for at least 6 hours before surgery.  No nicotine patches on the day of surgery.  Do not use any "recreational" drugs for at least a week (preferably 2 weeks) before your surgery.  Please be advised that the combination of cocaine and anesthesia may have negative outcomes, up to and including death. If you test positive for cocaine, your surgery will be cancelled.  On the morning of surgery brush your teeth with toothpaste and water, you may rinse your mouth with mouthwash if you wish. Do not swallow any toothpaste or mouthwash.  Use CHG Soap as directed on instruction sheet.  Do not wear jewelry, make-up, hairpins, clips or nail polish.  For welded (permanent) jewelry: bracelets, anklets, waist bands, etc.  Please have this removed prior to surgery.  If it is not removed, there is a chance that hospital personnel will need to cut it off on the day of surgery.  Do not wear lotions, powders, or perfumes.   Do not shave body hair from the neck down 48 hours before surgery.  Contact lenses, hearing aids and dentures may not be worn into surgery.  Do not bring valuables to the hospital. [facility] is not responsible for any missing/lost belongings or valuables.   Bring your C-PAP to the hospital in case you may have to spend the night.  Notify your doctor if there is any change in your medical condition (cold, fever, infection).  Wear comfortable clothing (specific to your surgery type) to the hospital.  After surgery, you can help prevent lung complications by doing breathing exercises.  Take deep breaths and cough every 1-2 hours. Your doctor may order a device called an Incentive Spirometer to help you take deep breaths.  If you are being admitted to the hospital  overnight, leave your suitcase in the car. After surgery it may be brought to your room.  In case of increased patient census, it may be necessary for you, the patient, to continue your postoperative care in the Same Day Surgery department.  If you are being discharged the day of surgery, you will not be allowed to drive home. You will need a responsible individual to drive you home and stay with you for 24 hours after surgery.   If you are taking public transportation, you will need to have a responsible individual with you.  Please call the Pre-admissions Testing Dept. at [phone] if you have any questions about these instructions.  Surgery Visitation Policy:  Patients having surgery or a procedure may have two visitors.  Children under the age of 45 must have an adult with them who is not the patient.  Temporary Visitor Restrictions Due to increasing cases of flu, RSV and COVID-19: Children ages 78 and under will not be able to visit patients in [facility] hospitals under most circumstances.  Inpatient Visitation:    Visiting hours are 7 a.m. to 8 p.m. Up to four visitors are allowed at one time in a patient room. The visitors may rotate out with other people during the day.  One visitor age 8 or older may stay with the patient overnight and must be in the room by 8 p.m.    Pre-operative 5 CHG Bath Instructions   You can play a key role in reducing the risk of infection after surgery. Your skin needs to be as free of germs as possible. You can reduce the number of germs on your skin by washing with CHG (chlorhexidine gluconate) soap before surgery. CHG is an antiseptic soap that kills germs and continues to kill germs even after washing.   DO NOT use if you have an allergy to chlorhexidine/CHG or antibacterial soaps. If your skin becomes reddened or irritated, stop using the CHG and notify one of our RNs at [phone].   Please shower with the CHG soap starting 4 days  before surgery using the following schedule:     Please keep in mind the following:  DO NOT shave, including legs and underarms, starting the day of your first shower.   You may shave your face at any point before/day of surgery.  Place clean sheets on your bed the day you start using CHG soap. Use a clean washcloth (not used since being washed) for each shower. DO NOT sleep with pets once you start using the CHG.   CHG Shower Instructions:  If you choose to wash your hair and private area, wash first with your normal shampoo/soap.  After you use shampoo/soap, rinse your hair and body thoroughly to remove shampoo/soap residue.  Turn the water OFF and apply about 3 tablespoons (45 ml) of CHG soap to a CLEAN washcloth.  Apply CHG soap ONLY FROM YOUR NECK DOWN TO YOUR TOES (washing for 3-5 minutes)  DO NOT use CHG soap on face, private areas, open wounds, or sores.  Pay  special attention to the area where your surgery is being performed.  If you are having back surgery, having someone wash your back for you may be helpful. Wait 2 minutes after CHG soap is applied, then you may rinse off the CHG soap.  Pat dry with a clean towel  Put on clean clothes/pajamas   If you choose to wear lotion, please use ONLY the CHG-compatible lotions on the back of this paper.     Additional instructions for the day of surgery: DO NOT APPLY any lotions, deodorants, cologne, or perfumes.   Put on clean/comfortable clothes.  Brush your teeth.  Ask your nurse before applying any prescription medications to the skin.      CHG Compatible Lotions   Aveeno Moisturizing lotion  Cetaphil Moisturizing Cream  Cetaphil Moisturizing Lotion  Clairol Herbal Essence Moisturizing Lotion, Dry Skin  Clairol Herbal Essence Moisturizing Lotion, Extra Dry Skin  Clairol Herbal Essence Moisturizing Lotion, Normal Skin  Curel Age Defying Therapeutic Moisturizing Lotion with Alpha Hydroxy  Curel Extreme Care Body Lotion   Curel Soothing Hands Moisturizing Hand Lotion  Curel Therapeutic Moisturizing Cream, Fragrance-Free  Curel Therapeutic Moisturizing Lotion, Fragrance-Free  Curel Therapeutic Moisturizing Lotion, Original Formula  Eucerin Daily Replenishing Lotion  Eucerin Dry Skin Therapy Plus Alpha Hydroxy Crme  Eucerin Dry Skin Therapy Plus Alpha Hydroxy Lotion  Eucerin Original Crme  Eucerin Original Lotion  Eucerin Plus Crme Eucerin Plus Lotion  Eucerin TriLipid Replenishing Lotion  Keri Anti-Bacterial Hand Lotion  Keri Deep Conditioning Original Lotion Dry Skin Formula Softly Scented  Keri Deep Conditioning Original Lotion, Fragrance Free Sensitive Skin Formula  Keri Lotion Fast Absorbing Fragrance Free Sensitive Skin Formula  Keri Lotion Fast Absorbing Softly Scented Dry Skin Formula  Keri Original Lotion  Keri Skin Renewal Lotion Keri Silky Smooth Lotion  Keri Silky Smooth Sensitive Skin Lotion  Nivea Body Creamy Conditioning Oil  Nivea Body Extra Enriched Lotion  Nivea Body Original Lotion  Nivea Body Sheer Moisturizing Lotion Nivea Crme  Nivea Skin Firming Lotion  NutraDerm 30 Skin Lotion  NutraDerm Skin Lotion  NutraDerm Therapeutic Skin Cream  NutraDerm Therapeutic Skin Lotion  ProShield Protective Hand Cream  Provon moisturizing lotion    Preoperative Educational Videos for Total Hip, Knee and Shoulder Replacements  To better prepare for surgery, please view our videos that explain the physical activity and discharge planning required to have the best surgical recovery at [facility].  [email or web address]  Questions? Call [phone] or email [email or web address]

## 2023-02-09 DIAGNOSIS — F3132 Bipolar disorder, current episode depressed, moderate: Secondary | ICD-10-CM | POA: Diagnosis not present

## 2023-02-13 ENCOUNTER — Encounter: Payer: Self-pay | Admitting: Orthopedic Surgery

## 2023-02-13 MED ORDER — GABAPENTIN 300 MG PO CAPS
300.0000 mg | ORAL_CAPSULE | Freq: Once | ORAL | Status: AC
  Administered 2023-02-14: 300 mg via ORAL

## 2023-02-13 MED ORDER — CELECOXIB 200 MG PO CAPS
400.0000 mg | ORAL_CAPSULE | Freq: Once | ORAL | Status: AC
  Administered 2023-02-14: 400 mg via ORAL

## 2023-02-13 MED ORDER — LACTATED RINGERS IV SOLN
INTRAVENOUS | Status: DC
Start: 2023-02-13 — End: 2023-02-14

## 2023-02-13 MED ORDER — DEXAMETHASONE SODIUM PHOSPHATE 10 MG/ML IJ SOLN
8.0000 mg | Freq: Once | INTRAMUSCULAR | Status: AC
  Administered 2023-02-14: 8 mg via INTRAVENOUS

## 2023-02-13 MED ORDER — CHLORHEXIDINE GLUCONATE 4 % EX SOLN: 60.0000 mL | Freq: Once | CUTANEOUS | Status: DC

## 2023-02-13 MED ORDER — CEFAZOLIN SODIUM-DEXTROSE 2-4 GM/100ML-% IV SOLN
2.0000 g | INTRAVENOUS | Status: AC
  Administered 2023-02-14: 2 g via INTRAVENOUS

## 2023-02-13 MED ORDER — TRANEXAMIC ACID-NACL 1000-0.7 MG/100ML-% IV SOLN
1000.0000 mg | INTRAVENOUS | Status: AC
  Administered 2023-02-14: 1000 mg via INTRAVENOUS

## 2023-02-13 MED ORDER — ORAL CARE MOUTH RINSE: 15.0000 mL | Freq: Once | OROMUCOSAL | Status: AC

## 2023-02-13 MED ORDER — CHLORHEXIDINE GLUCONATE 0.12 % MT SOLN
15.0000 mL | Freq: Once | OROMUCOSAL | Status: AC
  Administered 2023-02-14: 15 mL via OROMUCOSAL

## 2023-02-13 NOTE — H&P (Signed)
ORTHOPAEDIC HISTORY & PHYSICAL Latanya Maudlin, PA - 02/07/2023 3:45 PM EST Formatting of this note is different from the original. Images from the original note were not included. Chief Complaint Chief Complaint Patient presents with H&P Left total knee arthroplasty 02/14/23  Reason for Visit Krystal Weaver is a 68 y.o. who presents today for history and physical. She is to undergo a left total knee arthroplasty on 02/14/2023. Since her last visit at the clinic there have been no improvement in her condition. Patient expresses her desire to proceed with surgery.  She has a long history of progressive left knee pain. She localizes most of the pain along the medial aspect of the knee. She reports no swelling, no locking, and some giving way of the knee. The pain is aggravated by any weight bearing, going up and down stairs, standing, and walking. The knee pain limits the patient's ability to ambulate long distances. The patient has not appreciated any significant improvement despite NSAIDs and activity modification. She is not using any ambulatory aids. The patient states that the knee pain has progressed to the point that it is significantly interfering with her activities of daily living.  Of note, the patient has a history of supraventricular tachycardia with the last documented episode in 2020. She was seen by Dr. Lady Gary (Cardiology) at that time. She has used metoprolol on an as-needed basis since that time.  Past Medical History Past Medical History: Diagnosis Date Apnea Uses CPAP Bipolar disorder, unspecified (CMS/HHS-HCC) 08/24/2012 Type 2 Last Assessment & Plan: Formatting of this note might be different from the original. Stable Managed by Dr Mare Ferrari with Lithium . Renal and liver function normmal Lab Results Component Value Date CREATININE 1.02 08/24/2017 Lab Results Component Value Date ALT 13 08/24/2017 AST 19 08/24/2017 ALKPHOS 52 08/24/2017 BILITOT 0.6 08/24/2017 Chronic venous  insufficiency 11/03/2015 Hyperplastic colon polyp 03/03/2016 Osteopenia after menopause 01/14/2022 Last Assessment & Plan: Formatting of this note might be different from the original. T score -1.6 by DEXA Jan 2024 repeat in 2 years SVT (supraventricular tachycardia) (CMS/HHS-HCC) 10/13/2018 Tubular adenoma of colon 03/03/2016  Past Surgical History Past Surgical History: Procedure Laterality Date COLONOSCOPY 11/22/2005 Dr. Eber Hong @ [facility] - Nml, FHPolyps(m)(50s), rpt 10 yrs per MUS COLONOSCOPY 03/03/2016 Hyperplastic colon polyp/Tubular adenoma/FHx CP-Mother/Repeat 29yrs/MUS COLONOSCOPY 03/26/2021 PHxCP/normal colon/repeat 10 yrs/CTL  Past Family History History reviewed. No pertinent family history.  Medications Current Outpatient Medications Medication Sig Dispense Refill ascorbic acid, vitamin C, (VITAMIN C) 500 MG tablet Take 1 tablet (500 mg total) by mouth once daily cholecalciferol (VITAMIN D3) 1,000 unit capsule Take 1,000 Units by mouth once daily docusate (COLACE) 100 MG capsule Take 100 mg by mouth at bedtime Lactobacillus acidophilus (PROBIOTIC) 10 billion cell Cap Take 1 capsule by mouth once daily lamoTRIgine (LAMICTAL) 100 MG tablet Take 1.5 tablets by mouth once daily meloxicam (MOBIC) 7.5 MG tablet Take 1 tablet (7.5 mg total) by mouth 2 (two) times daily for 60 doses 60 tablet 0 metoprolol tartrate (LOPRESSOR) 25 MG tablet Take 25 mg by mouth once As needed for prolonged STV multivitamin with minerals tablet Take 1 tablet by mouth once daily omega 3-dha-epa-fish oil (FISH OIL) 1,000 mg (120 mg-180 mg) Cap Take 1 capsule by mouth once daily sennosides-docusate (SENOKOT-S) 8.6-50 mg tablet Take 1 tablet by mouth every morning traZODone (DESYREL) 50 MG tablet Take 50-100 mg by mouth at bedtime magnesium glycinate (MAG GLYCINATE) 100 mg Tab Take 400 mg by mouth at bedtime MAGNESIUM ORAL Take 400 mg by  mouth once daily (Patient not taking: Reported on  02/07/2023)  No current facility-administered medications for this visit.  Allergies No Known Allergies  Review of Systems A comprehensive 14 point ROS was performed, reviewed, and the pertinent orthopaedic findings are documented in the HPI.  Exam BP 108/68  Ht 165.1 cm (5\' 5" )  Wt 62.2 kg (137 lb 3.2 oz)  BMI 22.83 kg/m  General: Well-developed well-nourished female seen in no acute distress.  HEENT: Atraumatic,normocephalic. Pupils are equal and reactive to light. Oropharynx is clear with moist mucosa  Lungs: Clear to auscultation bilaterally  Cardiovascular: Regular rate and rhythm. Normal S1, S2. No murmurs. No appreciable gallops or rubs. Peripheral pulses are palpable.  Abdomen: Soft, non-tender, nondistended. Bowel sounds present  Extremity: Left Knee: Soft tissue swelling: none Effusion: none Erythema: none Crepitance: mild Tenderness: medial Alignment: relative varus Mediolateral laxity: medial pseudolaxity Posterior sag: negative Patellar tracking: Good tracking without evidence of subluxation or tilt Atrophy: No significant atrophy. Quadriceps tone was fair to good. Range of motion: 0/0/132 degrees  Neurological:  The patient is alert and oriented Sensation to light touch appears to be intact and within normal limits Gross motor strength appeared to be equal to 5/5  Vascular :  Peripheral pulses felt to be palpable. Capillary refill appears to be intact and within normal limits  X-ray  1. AP standing, lateral and sunrise view of the left knee and interpreted today's visit shows bone-on-bone to the medial compartment space with slight varus deformity. She is noted to have osteophytes in tricompartmental flexion. There is significant subchondral sclerosis to the medial tibial plateau. Patella appears to be tracking well.  Impression  1. Degenerative arthrosis left knee  Plan  1. I have gone over the patient's medication on today's visit 2.  Past medical history reviewed 3. Patient would like to go home same day if possible. 4. Return to clinic 2 weeks postop. Sooner if any problems 5. Postop rehab course discussed  This note was generated in part with voice recognition software and I apologize for any typographical errors that were not detected and corrected   Tera Partridge PA Electronically signed by Latanya Maudlin, PA at 02/07/2023 4:39 PM EST

## 2023-02-14 ENCOUNTER — Observation Stay
Admission: RE | Admit: 2023-02-14 | Discharge: 2023-02-15 | Disposition: A | Discharge status: Home / Self Care | Payer: Medicare HMO | Source: Ambulatory Visit | Attending: Orthopedic Surgery | Admitting: Orthopedic Surgery

## 2023-02-14 ENCOUNTER — Encounter: Payer: Self-pay | Admitting: Orthopedic Surgery

## 2023-02-14 ENCOUNTER — Observation Stay: Payer: Medicare HMO

## 2023-02-14 ENCOUNTER — Ambulatory Visit: Payer: Medicare HMO | Admitting: Urgent Care

## 2023-02-14 ENCOUNTER — Other Ambulatory Visit: Payer: Self-pay

## 2023-02-14 ENCOUNTER — Ambulatory Visit: Payer: Self-pay | Admitting: Anesthesiology

## 2023-02-14 ENCOUNTER — Encounter
Admission: RE | Disposition: A | Discharge status: Home / Self Care | Payer: Self-pay | Source: Ambulatory Visit | Attending: Orthopedic Surgery

## 2023-02-14 DIAGNOSIS — C439 Malignant melanoma of skin, unspecified: Secondary | ICD-10-CM

## 2023-02-14 DIAGNOSIS — E119 Type 2 diabetes mellitus without complications: Secondary | ICD-10-CM | POA: Insufficient documentation

## 2023-02-14 DIAGNOSIS — Z01812 Encounter for preprocedural laboratory examination: Secondary | ICD-10-CM

## 2023-02-14 DIAGNOSIS — Z79899 Other long term (current) drug therapy: Secondary | ICD-10-CM | POA: Diagnosis not present

## 2023-02-14 DIAGNOSIS — I83813 Varicose veins of bilateral lower extremities with pain: Secondary | ICD-10-CM

## 2023-02-14 DIAGNOSIS — Z96652 Presence of left artificial knee joint: Secondary | ICD-10-CM | POA: Diagnosis not present

## 2023-02-14 DIAGNOSIS — I872 Venous insufficiency (chronic) (peripheral): Secondary | ICD-10-CM

## 2023-02-14 DIAGNOSIS — M1712 Unilateral primary osteoarthritis, left knee: Secondary | ICD-10-CM | POA: Diagnosis not present

## 2023-02-14 DIAGNOSIS — G4733 Obstructive sleep apnea (adult) (pediatric): Secondary | ICD-10-CM

## 2023-02-14 DIAGNOSIS — R609 Edema, unspecified: Secondary | ICD-10-CM | POA: Diagnosis not present

## 2023-02-14 DIAGNOSIS — I471 Supraventricular tachycardia, unspecified: Secondary | ICD-10-CM

## 2023-02-14 HISTORY — PX: KNEE ARTHROPLASTY: SHX992

## 2023-02-14 SURGERY — ARTHROPLASTY, KNEE, TOTAL, USING IMAGELESS COMPUTER-ASSISTED NAVIGATION
Anesthesia: General | Site: Knee | Laterality: Left

## 2023-02-14 MED ORDER — GABAPENTIN 300 MG PO CAPS
ORAL_CAPSULE | ORAL | Status: AC
  Filled 2023-02-14: qty 1

## 2023-02-14 MED ORDER — ALUM & MAG HYDROXIDE-SIMETH 200-200-20 MG/5ML PO SUSP: 30.0000 mL | ORAL | Status: DC | PRN

## 2023-02-14 MED ORDER — SURGIRINSE WOUND IRRIGATION SYSTEM - OPTIME
TOPICAL | Status: DC | PRN
  Administered 2023-02-14: 450 mL via TOPICAL

## 2023-02-14 MED ORDER — ONDANSETRON HCL 4 MG/2ML IJ SOLN
INTRAMUSCULAR | Status: AC
  Filled 2023-02-14: qty 2

## 2023-02-14 MED ORDER — TRANEXAMIC ACID-NACL 1000-0.7 MG/100ML-% IV SOLN
1000.0000 mg | Freq: Once | INTRAVENOUS | Status: AC
  Administered 2023-02-14: 1000 mg via INTRAVENOUS

## 2023-02-14 MED ORDER — FENTANYL CITRATE (PF) 100 MCG/2ML IJ SOLN: 25.0000 ug | INTRAMUSCULAR | Status: DC | PRN

## 2023-02-14 MED ORDER — LAMOTRIGINE 25 MG PO TABS
150.0000 mg | ORAL_TABLET | Freq: Every day | ORAL | Status: DC
  Administered 2023-02-15: 150 mg via ORAL
  Filled 2023-02-14: qty 2

## 2023-02-14 MED ORDER — ACETAMINOPHEN 10 MG/ML IV SOLN
1000.0000 mg | Freq: Four times a day (QID) | INTRAVENOUS | Status: AC
Start: 2023-02-14 — End: 2023-02-15
  Administered 2023-02-14 – 2023-02-15 (×3): 1000 mg via INTRAVENOUS
  Filled 2023-02-14 (×2): qty 100

## 2023-02-14 MED ORDER — METOCLOPRAMIDE HCL 10 MG PO TABS
10.0000 mg | ORAL_TABLET | Freq: Three times a day (TID) | ORAL | Status: DC
  Administered 2023-02-14 – 2023-02-15 (×3): 10 mg via ORAL
  Filled 2023-02-14 (×3): qty 1

## 2023-02-14 MED ORDER — SODIUM CHLORIDE 0.9 % IV SOLN: INTRAVENOUS | Status: DC

## 2023-02-14 MED ORDER — BUPIVACAINE HCL (PF) 0.5 % IJ SOLN
INTRAMUSCULAR | Status: DC | PRN
  Administered 2023-02-14: 3 mL

## 2023-02-14 MED ORDER — EPHEDRINE SULFATE-NACL 50-0.9 MG/10ML-% IV SOSY
PREFILLED_SYRINGE | INTRAVENOUS | Status: DC | PRN
  Administered 2023-02-14: 5 mg via INTRAVENOUS

## 2023-02-14 MED ORDER — ENSURE PRE-SURGERY PO LIQD
296.0000 mL | Freq: Once | ORAL | Status: DC
  Filled 2023-02-14: qty 296

## 2023-02-14 MED ORDER — SODIUM CHLORIDE 0.9 % IV SOLN
INTRAVENOUS | Status: DC | PRN
  Administered 2023-02-14: 60 mL

## 2023-02-14 MED ORDER — TRAMADOL HCL 50 MG PO TABS
50.0000 mg | ORAL_TABLET | ORAL | Status: DC | PRN
  Administered 2023-02-14 – 2023-02-15 (×3): 50 mg via ORAL
  Filled 2023-02-14 (×3): qty 1

## 2023-02-14 MED ORDER — METOPROLOL TARTRATE 25 MG PO TABS: 25.0000 mg | ORAL_TABLET | Freq: Two times a day (BID) | ORAL | Status: DC | PRN

## 2023-02-14 MED ORDER — OXYCODONE HCL 5 MG PO TABS
5.0000 mg | ORAL_TABLET | ORAL | Status: DC | PRN
Start: 2023-02-14 — End: 2023-02-15
  Administered 2023-02-14: 5 mg via ORAL
  Filled 2023-02-14: qty 1

## 2023-02-14 MED ORDER — MIDAZOLAM HCL 5 MG/5ML IJ SOLN
INTRAMUSCULAR | Status: DC | PRN
  Administered 2023-02-14: 2 mg via INTRAVENOUS

## 2023-02-14 MED ORDER — ONDANSETRON HCL 4 MG PO TABS: 4.0000 mg | ORAL_TABLET | Freq: Four times a day (QID) | ORAL | Status: DC | PRN

## 2023-02-14 MED ORDER — PHENOL 1.4 % MT LIQD: 1.0000 | OROMUCOSAL | Status: DC | PRN

## 2023-02-14 MED ORDER — ONDANSETRON HCL 4 MG/2ML IJ SOLN: 4.0000 mg | Freq: Four times a day (QID) | INTRAMUSCULAR | Status: DC | PRN

## 2023-02-14 MED ORDER — ACETAMINOPHEN 10 MG/ML IV SOLN
INTRAVENOUS | Status: DC | PRN
  Administered 2023-02-14: 1000 mg via INTRAVENOUS

## 2023-02-14 MED ORDER — LIDOCAINE HCL (CARDIAC) PF 50 MG/5ML IV SOSY
PREFILLED_SYRINGE | INTRAVENOUS | Status: DC | PRN
  Administered 2023-02-14: 5 mL via INTRAVENOUS

## 2023-02-14 MED ORDER — OXYCODONE HCL 5 MG PO TABS: 10.0000 mg | ORAL_TABLET | ORAL | Status: DC | PRN

## 2023-02-14 MED ORDER — SENNOSIDES-DOCUSATE SODIUM 8.6-50 MG PO TABS: 1.0000 | ORAL_TABLET | Freq: Two times a day (BID) | ORAL | Status: DC

## 2023-02-14 MED ORDER — OXYCODONE HCL 5 MG PO TABS: 5.0000 mg | ORAL_TABLET | Freq: Once | ORAL | Status: DC | PRN

## 2023-02-14 MED ORDER — BISACODYL 10 MG RE SUPP: 10.0000 mg | Freq: Every day | RECTAL | Status: DC | PRN

## 2023-02-14 MED ORDER — HYDROMORPHONE HCL 1 MG/ML IJ SOLN: 0.5000 mg | INTRAMUSCULAR | Status: DC | PRN

## 2023-02-14 MED ORDER — MENTHOL 3 MG MT LOZG: 1.0000 | LOZENGE | OROMUCOSAL | Status: DC | PRN

## 2023-02-14 MED ORDER — TRANEXAMIC ACID-NACL 1000-0.7 MG/100ML-% IV SOLN
INTRAVENOUS | Status: AC
  Filled 2023-02-14: qty 100

## 2023-02-14 MED ORDER — CEFAZOLIN SODIUM-DEXTROSE 2-4 GM/100ML-% IV SOLN
INTRAVENOUS | Status: AC
  Filled 2023-02-14: qty 100

## 2023-02-14 MED ORDER — PANTOPRAZOLE SODIUM 40 MG PO TBEC
40.0000 mg | DELAYED_RELEASE_TABLET | Freq: Two times a day (BID) | ORAL | Status: DC
  Administered 2023-02-14 – 2023-02-15 (×2): 40 mg via ORAL
  Filled 2023-02-14 (×2): qty 1

## 2023-02-14 MED ORDER — ACETAMINOPHEN 10 MG/ML IV SOLN: 1000.0000 mg | Freq: Once | INTRAVENOUS | Status: DC | PRN

## 2023-02-14 MED ORDER — CELECOXIB 200 MG PO CAPS
ORAL_CAPSULE | ORAL | Status: AC
  Filled 2023-02-14: qty 1

## 2023-02-14 MED ORDER — PROPOFOL 1000 MG/100ML IV EMUL
INTRAVENOUS | Status: AC
  Filled 2023-02-14: qty 100

## 2023-02-14 MED ORDER — LACTATED RINGERS IV SOLN: INTRAVENOUS | Status: AC

## 2023-02-14 MED ORDER — FENTANYL CITRATE (PF) 100 MCG/2ML IJ SOLN
INTRAMUSCULAR | Status: AC
  Filled 2023-02-14: qty 2

## 2023-02-14 MED ORDER — ONDANSETRON HCL 4 MG/2ML IJ SOLN
INTRAMUSCULAR | Status: DC | PRN
  Administered 2023-02-14: 4 mg via INTRAVENOUS

## 2023-02-14 MED ORDER — CELECOXIB 200 MG PO CAPS
200.0000 mg | ORAL_CAPSULE | Freq: Two times a day (BID) | ORAL | Status: DC
  Administered 2023-02-14 – 2023-02-15 (×3): 200 mg via ORAL
  Filled 2023-02-14 (×3): qty 1

## 2023-02-14 MED ORDER — PHENYLEPHRINE HCL-NACL 20-0.9 MG/250ML-% IV SOLN
INTRAVENOUS | Status: DC | PRN
  Administered 2023-02-14: 26.667 ug/min via INTRAVENOUS

## 2023-02-14 MED ORDER — BUPIVACAINE HCL (PF) 0.25 % IJ SOLN
INTRAMUSCULAR | Status: DC | PRN
  Administered 2023-02-14: 60 mL

## 2023-02-14 MED ORDER — DIPHENHYDRAMINE HCL 12.5 MG/5ML PO ELIX
12.5000 mg | ORAL_SOLUTION | ORAL | Status: DC | PRN
Start: 2023-02-14 — End: 2023-02-15

## 2023-02-14 MED ORDER — ASPIRIN 81 MG PO CHEW
81.0000 mg | CHEWABLE_TABLET | Freq: Two times a day (BID) | ORAL | Status: DC
  Administered 2023-02-14 – 2023-02-15 (×2): 81 mg via ORAL
  Filled 2023-02-14 (×2): qty 1

## 2023-02-14 MED ORDER — DEXAMETHASONE SODIUM PHOSPHATE 10 MG/ML IJ SOLN
INTRAMUSCULAR | Status: AC
  Filled 2023-02-14: qty 1

## 2023-02-14 MED ORDER — STERILE WATER FOR IRRIGATION IR SOLN
Status: DC | PRN
  Administered 2023-02-14: 1000 mL

## 2023-02-14 MED ORDER — PROPOFOL 500 MG/50ML IV EMUL
INTRAVENOUS | Status: DC | PRN
  Administered 2023-02-14: 30 mg via INTRAVENOUS
  Administered 2023-02-14: 125 ug/kg/min via INTRAVENOUS

## 2023-02-14 MED ORDER — LIDOCAINE HCL (PF) 2 % IJ SOLN
INTRAMUSCULAR | Status: AC
  Filled 2023-02-14: qty 5

## 2023-02-14 MED ORDER — ONDANSETRON HCL 4 MG/2ML IJ SOLN: 4.0000 mg | Freq: Once | INTRAMUSCULAR | Status: DC | PRN

## 2023-02-14 MED ORDER — FENTANYL CITRATE (PF) 100 MCG/2ML IJ SOLN
INTRAMUSCULAR | Status: DC | PRN
  Administered 2023-02-14 (×2): 25 ug via INTRAVENOUS
  Administered 2023-02-14: 50 ug via INTRAVENOUS

## 2023-02-14 MED ORDER — ACETAMINOPHEN 325 MG PO TABS: 325.0000 mg | ORAL_TABLET | Freq: Four times a day (QID) | ORAL | Status: DC | PRN

## 2023-02-14 MED ORDER — FLEET ENEMA RE ENEM: 1.0000 | ENEMA | Freq: Once | RECTAL | Status: DC | PRN

## 2023-02-14 MED ORDER — OXYCODONE HCL 5 MG/5ML PO SOLN: 5.0000 mg | Freq: Once | ORAL | Status: DC | PRN

## 2023-02-14 MED ORDER — BUPIVACAINE HCL (PF) 0.5 % IJ SOLN
INTRAMUSCULAR | Status: AC
  Filled 2023-02-14: qty 10

## 2023-02-14 MED ORDER — PHENYLEPHRINE HCL-NACL 20-0.9 MG/250ML-% IV SOLN
INTRAVENOUS | Status: AC
  Filled 2023-02-14: qty 250

## 2023-02-14 MED ORDER — FERROUS SULFATE 325 (65 FE) MG PO TABS
325.0000 mg | ORAL_TABLET | Freq: Two times a day (BID) | ORAL | Status: DC
  Administered 2023-02-14 – 2023-02-15 (×2): 325 mg via ORAL
  Filled 2023-02-14 (×2): qty 1

## 2023-02-14 MED ORDER — ACETAMINOPHEN 10 MG/ML IV SOLN
INTRAVENOUS | Status: AC
  Filled 2023-02-14: qty 100

## 2023-02-14 MED ORDER — TRAZODONE HCL 50 MG PO TABS
50.0000 mg | ORAL_TABLET | Freq: Every day | ORAL | Status: DC
  Administered 2023-02-14: 50 mg via ORAL
  Filled 2023-02-14: qty 1

## 2023-02-14 MED ORDER — MAGNESIUM HYDROXIDE 400 MG/5ML PO SUSP: 30.0000 mL | Freq: Every day | ORAL | Status: DC

## 2023-02-14 MED ORDER — MIDAZOLAM HCL 2 MG/2ML IJ SOLN
INTRAMUSCULAR | Status: AC
  Filled 2023-02-14: qty 2

## 2023-02-14 MED ORDER — ACETAMINOPHEN 10 MG/ML IV SOLN: 1000.0000 mg | Freq: Four times a day (QID) | INTRAVENOUS | Status: DC

## 2023-02-14 MED ORDER — CHLORHEXIDINE GLUCONATE 0.12 % MT SOLN
OROMUCOSAL | Status: AC
  Filled 2023-02-14: qty 15

## 2023-02-14 MED ORDER — CEFAZOLIN SODIUM-DEXTROSE 2-4 GM/100ML-% IV SOLN
2.0000 g | Freq: Four times a day (QID) | INTRAVENOUS | Status: AC
  Administered 2023-02-14 (×2): 2 g via INTRAVENOUS
  Filled 2023-02-14: qty 100

## 2023-02-14 MED ORDER — SODIUM CHLORIDE 0.9 % IR SOLN
Status: DC | PRN
  Administered 2023-02-14: 3000 mL

## 2023-02-14 MED ORDER — SENNOSIDES-DOCUSATE SODIUM 8.6-50 MG PO TABS
1.0000 | ORAL_TABLET | Freq: Two times a day (BID) | ORAL | Status: DC
  Administered 2023-02-14 – 2023-02-15 (×2): 1 via ORAL
  Filled 2023-02-14 (×2): qty 1

## 2023-02-14 MED ORDER — PROPOFOL 10 MG/ML IV BOLUS
INTRAVENOUS | Status: AC
  Filled 2023-02-14: qty 20

## 2023-02-14 SURGICAL SUPPLY — 67 items
ATTUNE PSFEM LTSZ6 NARCEM KNEE (Femur) IMPLANT
ATTUNE PSRP INSR SZ6 6 KNEE (Insert) IMPLANT
BASE TIBIAL ROT PLAT SZ 5 KNEE (Knees) IMPLANT
BATTERY INSTRU NAVIGATION (MISCELLANEOUS) ×4 IMPLANT
BIT DRILL QUICK REL 1/8 2PK SL (BIT) ×1 IMPLANT
BLADE CLIPPER SURG (BLADE) IMPLANT
BLADE SAW 70X12.5 (BLADE) ×1 IMPLANT
BLADE SAW 90X13X1.19 OSCILLAT (BLADE) ×1 IMPLANT
BLADE SAW 90X25X1.19 OSCILLAT (BLADE) ×1 IMPLANT
BONE CEMENT GENTAMICIN (Cement) ×2 IMPLANT
BRUSH SCRUB EZ PLAIN DRY (MISCELLANEOUS) ×1 IMPLANT
CEMENT BONE GENTAMICIN 40 (Cement) IMPLANT
COOLER POLAR GLACIER W/PUMP (MISCELLANEOUS) ×1 IMPLANT
CUFF TRNQT CYL 24X4X16.5-23 (TOURNIQUET CUFF) IMPLANT
DRAPE SHEET LG 3/4 BI-LAMINATE (DRAPES) ×1 IMPLANT
DRSG AQUACEL AG ADV 3.5X14 (GAUZE/BANDAGES/DRESSINGS) ×1 IMPLANT
DRSG MEPILEX SACRM 8.7X9.8 (GAUZE/BANDAGES/DRESSINGS) ×1 IMPLANT
DRSG TEGADERM 4X4.75 (GAUZE/BANDAGES/DRESSINGS) ×1 IMPLANT
DRSG XEROFORM 1X8 (GAUZE/BANDAGES/DRESSINGS) IMPLANT
DURAPREP 26ML APPLICATOR (WOUND CARE) ×2 IMPLANT
ELECT CAUTERY BLADE 6.4 (BLADE) ×1 IMPLANT
ELECT REM PT RETURN 9FT ADLT (ELECTROSURGICAL) ×1
ELECTRODE REM PT RTRN 9FT ADLT (ELECTROSURGICAL) ×1 IMPLANT
EVACUATOR 1/8 PVC DRAIN (DRAIN) ×1 IMPLANT
EX-PIN ORTHOLOCK NAV 4X150 (PIN) ×2 IMPLANT
GAUZE XEROFORM 1X8 LF (GAUZE/BANDAGES/DRESSINGS) ×1 IMPLANT
GLOVE BIOGEL M STRL SZ7.5 (GLOVE) ×6 IMPLANT
GLOVE SURG UNDER POLY LF SZ8 (GLOVE) ×2 IMPLANT
GOWN STRL REUS W/ TWL LRG LVL3 (GOWN DISPOSABLE) ×1 IMPLANT
GOWN STRL REUS W/ TWL XL LVL3 (GOWN DISPOSABLE) ×1 IMPLANT
GOWN TOGA ZIPPER T7+ PEEL AWAY (MISCELLANEOUS) ×1 IMPLANT
HOLDER FOLEY CATH W/STRAP (MISCELLANEOUS) ×1 IMPLANT
HOOD PEEL AWAY T7 (MISCELLANEOUS) ×1 IMPLANT
IV NS IRRIG 3000ML ARTHROMATIC (IV SOLUTION) ×1 IMPLANT
KIT TURNOVER KIT A (KITS) ×1 IMPLANT
KNIFE SCULPS 14X20 (INSTRUMENTS) ×1 IMPLANT
MANIFOLD NEPTUNE II (INSTRUMENTS) ×2 IMPLANT
NDL SPNL 20GX3.5 QUINCKE YW (NEEDLE) ×2 IMPLANT
NEEDLE SPNL 20GX3.5 QUINCKE YW (NEEDLE) ×2
PACK TOTAL KNEE (MISCELLANEOUS) ×1 IMPLANT
PAD ABD DERMACEA PRESS 5X9 (GAUZE/BANDAGES/DRESSINGS) ×2 IMPLANT
PAD ARMBOARD 7.5X6 YLW CONV (MISCELLANEOUS) ×3 IMPLANT
PAD WRAPON POLAR KNEE (MISCELLANEOUS) ×1 IMPLANT
PATELLA MEDIAL ATTUN 35MM KNEE (Knees) IMPLANT
PENCIL SMOKE EVACUATOR COATED (MISCELLANEOUS) ×1 IMPLANT
PIN DRILL FIX HALF THREAD (BIT) ×2 IMPLANT
PIN FIXATION 1/8DIA X 3INL (PIN) ×1 IMPLANT
PULSAVAC PLUS IRRIG FAN TIP (DISPOSABLE) ×1
SOLUTION IRRIG SURGIPHOR (IV SOLUTION) ×1 IMPLANT
SPONGE DRAIN TRACH 4X4 STRL 2S (GAUZE/BANDAGES/DRESSINGS) ×1 IMPLANT
SPONGE T-LAP 18X18 ~~LOC~~+RFID (SPONGE) IMPLANT
STAPLER SKIN PROX 35W (STAPLE) ×1 IMPLANT
STOCKINETTE STRL BIAS CUT 8X4 (MISCELLANEOUS) ×1 IMPLANT
STRAP TIBIA SHORT (MISCELLANEOUS) ×1 IMPLANT
SUCTION TUBE FRAZIER 10FR DISP (SUCTIONS) ×1 IMPLANT
SUT VIC AB 0 CT1 36 (SUTURE) ×1 IMPLANT
SUT VIC AB 1 CT1 36 (SUTURE) ×2 IMPLANT
SUT VIC AB 2-0 CT2 27 (SUTURE) ×1 IMPLANT
SYR 30ML LL (SYRINGE) ×2 IMPLANT
TIBIAL BASE ROT PLAT SZ 5 KNEE (Knees) ×1 IMPLANT
TIP FAN IRRIG PULSAVAC PLUS (DISPOSABLE) ×1 IMPLANT
TOWEL OR 17X26 4PK STRL BLUE (TOWEL DISPOSABLE) ×1 IMPLANT
TOWER CARTRIDGE SMART MIX (DISPOSABLE) ×1 IMPLANT
TRAP FLUID SMOKE EVACUATOR (MISCELLANEOUS) ×1 IMPLANT
TRAY FOLEY MTR SLVR 16FR STAT (SET/KITS/TRAYS/PACK) ×1 IMPLANT
WATER STERILE IRR 1000ML POUR (IV SOLUTION) ×1 IMPLANT
WRAPON POLAR PAD KNEE (MISCELLANEOUS) ×1

## 2023-02-14 NOTE — Anesthesia Preprocedure Evaluation (Addendum)
Anesthesia Evaluation  Patient identified by MRN, date of birth, ID band Patient awake    Reviewed: Allergy & Precautions, NPO status , Patient's Chart, lab work & pertinent test results  History of Anesthesia Complications Negative for: history of anesthetic complications  Airway Mallampati: I   Neck ROM: Full    Dental no notable dental hx.    Pulmonary sleep apnea and Continuous Positive Airway Pressure Ventilation    Pulmonary exam normal breath sounds clear to auscultation       Cardiovascular Normal cardiovascular exam+ dysrhythmias Supra Ventricular Tachycardia  Rhythm:Regular Rate:Normal  ECG 10/12/22: Sinus Rhythm  -Negative precordial T-waves. WITHIN NORMAL LIMITS   Neuro/Psych  PSYCHIATRIC DISORDERS  Depression Bipolar Disorder   negative neurological ROS     GI/Hepatic negative GI ROS,,,  Endo/Other  negative endocrine ROS    Renal/GU      Musculoskeletal  (+) Arthritis ,    Abdominal   Peds  Hematology negative hematology ROS (+)   Anesthesia Other Findings   Reproductive/Obstetrics                             Anesthesia Physical Anesthesia Plan  ASA: 2  Anesthesia Plan: General and Spinal   Post-op Pain Management:    Induction: Intravenous  PONV Risk Score and Plan: 3 and Propofol infusion, TIVA, Treatment may vary due to age or medical condition and Ondansetron  Airway Management Planned: Natural Airway and Nasal Cannula  Additional Equipment:   Intra-op Plan:   Post-operative Plan:   Informed Consent: I have reviewed the patients History and Physical, chart, labs and discussed the procedure including the risks, benefits and alternatives for the proposed anesthesia with the patient or authorized representative who has indicated his/her understanding and acceptance.       Plan Discussed with: CRNA  Anesthesia Plan Comments: (Plan for spinal and GA with  natural airway, LMA/GETA backup.  Patient consented for risks of anesthesia including but not limited to:  - adverse reactions to medications - damage to eyes, teeth, lips or other oral mucosa - nerve damage due to positioning  - sore throat or hoarseness - headache, bleeding, infection, nerve damage 2/2 spinal - damage to heart, brain, nerves, lungs, other parts of body or loss of life  Informed patient about role of CRNA in peri- and intra-operative care.  Patient voiced understanding.)        Anesthesia Quick Evaluation

## 2023-02-14 NOTE — Progress Notes (Signed)
Physical Therapy Evaluation Patient Details Name: Krystal Weaver MRN: [id number] DOB: 26-Nov-1955 Today's Date: 02/14/2023  History of Present Illness  Pt is s/p L TKA on 02/14/23.  Clinical Impression  Pt received in Semi-Fowler's position and agreeable to therapy.  Pt noted to not be in a lot of pain upon arrival and is ready to begin.  Pt mobilized well with the use of the walker and was able to ambulate a majority of the nursing area in the post op recovery area.  Pt attempted to perform the stairs in order to simulate how she would perform in case she needed to perform at later date or at work.  Pt able to perform without any complication.  Pt returned to room and transferred back to bed with all needs met.  Pt given HEP to perform and was advised therapist would attempt to go over them tomorrow.  Pt with call bell within reach and family members in room.        If plan is discharge home, recommend the following: A little help with walking and/or transfers;A little help with bathing/dressing/bathroom;Assistance with cooking/housework;Assist for transportation   Can travel by private vehicle        Equipment Recommendations    Recommendations for Other Services       Functional Status Assessment Patient has had a recent decline in their functional status and demonstrates the ability to make significant improvements in function in a reasonable and predictable amount of time.     Precautions / Restrictions        Mobility  Bed Mobility Overal bed mobility: Needs Assistance Bed Mobility: Supine to Sit     Supine to sit: Supervision     General bed mobility comments: Pt with slowed attempt, but able to perform without very much cuing.    Transfers Overall transfer level: Needs assistance Equipment used: Rolling walker (2 wheels) Transfers: Sit to/from Stand Sit to Stand: Contact guard assist           General transfer comment: Pt able to stand with good technique,  however given verbal cues to utilze the L LE and place weight instead of offloading.    Ambulation/Gait Ambulation/Gait assistance: Contact guard assist Gait Distance (Feet): 200 Feet Assistive device: Rolling walker (2 wheels) Gait Pattern/deviations: Step-through pattern Gait velocity: decreased     General Gait Details: Pt with reduced L knee bending during gait, likely due to bandaging.  Stairs Stairs: Yes Stairs assistance: Contact guard assist Stair Management: One rail Left, Sideways, Two rails, Forwards, Step to pattern Number of Stairs: 8 General stair comments: 8 total stairs, utilizing different mechanisms for going up/down for safety.  Wheelchair Mobility     Tilt Bed    Modified Rankin (Stroke Patients Only)       Balance Overall balance assessment: Needs assistance Sitting-balance support: Feet unsupported, No upper extremity supported Sitting balance-Leahy Scale: Good     Standing balance support: Bilateral upper extremity supported, During functional activity, Reliant on assistive device for balance Standing balance-Leahy Scale: Fair                               Pertinent Vitals/Pain Pain Assessment Pain Assessment: No/denies pain    Home Living Family/patient expects to be discharged to:: Private residence Living Arrangements: Spouse/significant other Available Help at Discharge: Family;Friend(s) Type of Home: House Home Access: Level entry       Home Layout: One level  Prior Function Prior Level of Function : Independent/Modified Independent                     Extremity/Trunk Assessment   Upper Extremity Assessment Upper Extremity Assessment: Overall WFL for tasks assessed    Lower Extremity Assessment Lower Extremity Assessment: LLE deficits/detail LLE Deficits / Details: L LE weakness s/p surgical intervention       Communication   Communication Communication: No apparent difficulties   Cognition Arousal: Alert Behavior During Therapy: WFL for tasks assessed/performed Overall Cognitive Status: Within Functional Limits for tasks assessed                                          General Comments      Exercises     Assessment/Plan    PT Assessment Patient needs continued PT services  PT Problem List Decreased strength;Decreased range of motion;Decreased activity tolerance;Decreased balance;Decreased mobility;Decreased knowledge of use of DME;Decreased safety awareness       PT Treatment Interventions DME instruction;Gait training;Stair training;Functional mobility training;Therapeutic activities;Therapeutic exercise;Balance training;Neuromuscular re-education    PT Goals (Current goals can be found in the Care Plan section)  Acute Rehab PT Goals Patient Stated Goal: to get stronger and go home PT Goal Formulation: With patient Time For Goal Achievement: 02/28/23 Potential to Achieve Goals: Good    Frequency BID     Co-evaluation               AM-PAC PT "6 Clicks" Mobility  Outcome Measure Help needed turning from your back to your side while in a flat bed without using bedrails?: A Little Help needed moving from lying on your back to sitting on the side of a flat bed without using bedrails?: A Little Help needed moving to and from a bed to a chair (including a wheelchair)?: A Little Help needed standing up from a chair using your arms (e.g., wheelchair or bedside chair)?: A Little Help needed to walk in hospital room?: A Little Help needed climbing 3-5 steps with a railing? : A Little 6 Click Score: 18    End of Session Equipment Utilized During Treatment: Gait belt Activity Tolerance: Patient tolerated treatment well Patient left: in bed;with call bell/phone within reach;with family/visitor present Nurse Communication: Mobility status PT Visit Diagnosis: Unsteadiness on feet (R26.81);Other abnormalities of gait and mobility  (R26.89);Muscle weakness (generalized) (M62.81);Difficulty in walking, not elsewhere classified (R26.2)    Time: 4098-1191 PT Time Calculation (min) (ACUTE ONLY): 34 min   Charges:   PT Evaluation $PT Eval Low Complexity: 1 Low PT Treatments $Therapeutic Activity: 8-22 mins PT General Charges $$ ACUTE PT VISIT: 1 Visit         Nolon Bussing, PT, DPT Physical Therapist - [facility]  02/14/23, 4:50 PM

## 2023-02-14 NOTE — Transfer of Care (Signed)
 Immediate Anesthesia Transfer of Care Note  Patient: Krystal Weaver  Procedure(s) Performed: COMPUTER ASSISTED TOTAL KNEE ARTHROPLASTY (Left: Knee)  Patient Location: PACU  Anesthesia Type:Spinal  Level of Consciousness: alert   Airway & Oxygen Therapy: Patient Spontanous Breathing and Patient connected to face mask oxygen  Post-op Assessment: Report given to RN and Post -op Vital signs reviewed and stable  Post vital signs: Reviewed and stable  Last Vitals:  Vitals Value Taken Time  BP 104/61 02/14/23 1115  Temp 36.7 C 02/14/23 1113  Pulse 97 02/14/23 1116  Resp 18 02/14/23 1116  SpO2 98 % 02/14/23 1116  Vitals shown include unfiled device data.  Last Pain:  Vitals:   02/14/23 0616  TempSrc: Temporal  PainSc: 4       Patients Stated Pain Goal: 0 (02/14/23 0865)  Complications: There were no known notable events for this encounter.

## 2023-02-14 NOTE — Progress Notes (Signed)
 Patient is not able to walk the distance required to go the bathroom, or he/she is unable to safely negotiate stairs required to access the bathroom.  A 3in1 BSC will alleviate this problem   Raad Clayson P. Angie Fava M.D.

## 2023-02-14 NOTE — Anesthesia Procedure Notes (Signed)
Spinal  Patient location during procedure: OR Start time: 02/14/2023 7:16 AM End time: 02/14/2023 7:22 AM Reason for block: surgical anesthesia Staffing Performed: anesthesiologist and resident/CRNA  Anesthesiologist: [name], MD Performed by: [name], MD Authorized by: [name], MD   Preanesthetic Checklist Completed: patient identified, IV checked, site marked, risks and benefits discussed, surgical consent, monitors and equipment checked, pre-op evaluation and timeout performed Spinal Block Patient position: sitting Prep: ChloraPrep Patient monitoring: heart rate, cardiac monitor, continuous pulse ox and blood pressure Approach: right paramedian Location: L2-3 Injection technique: single-shot Needle Needle type: Whitacre  Needle gauge: 22 G Needle length: 9 cm Assessment Sensory level: T4 Events: CSF return and second provider Additional Notes Several attempts due to apparent rightward lumbar scoliosis.  First attempt SRNA at L4-5 with deep os at each pass.  Next attempts by [facility] midline at L3-4 and L2-3 with os. Fourth and final attempt right paramedian with Whitacre 22 g.

## 2023-02-14 NOTE — Op Note (Signed)
OPERATIVE NOTE  DATE OF SURGERY:  02/14/2023  PATIENT NAME:  Krystal Weaver   DOB: 03/27/1955  MRN: [id number]  PRE-OPERATIVE DIAGNOSIS: Degenerative arthrosis of the left knee, primary  POST-OPERATIVE DIAGNOSIS:  Same  PROCEDURE:  Left total knee arthroplasty using computer-assisted navigation  SURGEON:  Jena Gauss. M.D.  ASSISTANT:  Gean Birchwood, PA-C (present and scrubbed throughout the case, critical for assistance with exposure, retraction, instrumentation, and closure)  ANESTHESIA: spinal  ESTIMATED BLOOD LOSS: 50 mL  FLUIDS REPLACED: 600 mL of crystalloid  TOURNIQUET TIME: 87 minutes  DRAINS: 2 medium Hemovac drains  SOFT TISSUE RELEASES: Anterior cruciate ligament, posterior cruciate ligament, deep medial collateral ligament, patellofemoral ligament  IMPLANTS UTILIZED: DePuy Attune size 6N posterior stabilized femoral component (cemented), size 5 rotating platform tibial component (cemented), 35 mm medialized dome patella (cemented), and a 6 mm stabilized rotating platform polyethylene insert.  INDICATIONS FOR SURGERY: Krystal Weaver is a 68 y.o. year old female with a long history of progressive knee pain. X-rays demonstrated severe degenerative changes in tricompartmental fashion. The patient had not seen any significant improvement despite conservative nonsurgical intervention. After discussion of the risks and benefits of surgical intervention, the patient expressed understanding of the risks benefits and agree with plans for total knee arthroplasty.   The risks, benefits, and alternatives were discussed at length including but not limited to the risks of infection, bleeding, nerve injury, stiffness, blood clots, the need for revision surgery, cardiopulmonary complications, among others, and they were willing to proceed.  PROCEDURE IN DETAIL: The patient was brought into the operating room and, after adequate spinal anesthesia was achieved, a tourniquet was  placed on the patient's upper thigh. The patient's knee and leg were cleaned and prepped with alcohol and DuraPrep and draped in the usual sterile fashion. A "timeout" was performed as per usual protocol. The lower extremity was exsanguinated using an Esmarch, and the tourniquet was inflated to 300 mmHg. An anterior longitudinal incision was made followed by a standard mid vastus approach. The deep fibers of the medial collateral ligament were elevated in a subperiosteal fashion off of the medial flare of the tibia so as to maintain a continuous soft tissue sleeve. The patella was subluxed laterally and the patellofemoral ligament was incised. Inspection of the knee demonstrated severe degenerative changes with full-thickness loss of articular cartilage. Osteophytes were debrided using a rongeur. Anterior and posterior cruciate ligaments were excised. Two 4.0 mm Schanz pins were inserted in the femur and into the tibia for attachment of the array of trackers used for computer-assisted navigation. Hip center was identified using a circumduction technique. Distal landmarks were mapped using the computer. The distal femur and proximal tibia were mapped using the computer. The distal femoral cutting guide was positioned using computer-assisted navigation so as to achieve a 5 distal valgus cut. The femur was sized and it was felt that a size 6N femoral component was appropriate. A size 6 femoral cutting guide was positioned and the anterior cut was performed and verified using the computer. This was followed by completion of the posterior and chamfer cuts. Femoral cutting guide for the central box was then positioned in the center box cut was performed.  Attention was then directed to the proximal tibia. Medial and lateral menisci were excised. The extramedullary tibial cutting guide was positioned using computer-assisted navigation so as to achieve a 0 varus-valgus alignment and 3 posterior slope. The cut was  performed and verified using the computer. The proximal tibia  was sized and it was felt that a size 5 tibial tray was appropriate. Tibial and femoral trials were inserted followed by insertion of a 6 mm polyethylene insert. This allowed for excellent mediolateral soft tissue balancing both in flexion and in full extension. Finally, the patella was cut and prepared so as to accommodate a 35 mm medialized dome patella. A patella trial was placed and the knee was placed through a range of motion with excellent patellar tracking appreciated. The femoral trial was removed after debridement of posterior osteophytes. The central post-hole for the tibial component was reamed followed by insertion of a keel punch. Tibial trials were then removed. Cut surfaces of bone were irrigated with copious amounts of normal saline using pulsatile lavage and then suctioned dry. Polymethylmethacrylate cement with gentamicin was prepared in the usual fashion using a vacuum mixer. Cement was applied to the cut surface of the proximal tibia as well as along the undersurface of a size 5 rotating platform tibial component. Tibial component was positioned and impacted into place. Excess cement was removed using Personal assistant. Cement was then applied to the cut surfaces of the femur as well as along the posterior flanges of the size 6N femoral component. The femoral component was positioned and impacted into place. Excess cement was removed using Personal assistant. A 6 mm polyethylene trial was inserted and the knee was brought into full extension with steady axial compression applied. Finally, cement was applied to the backside of a 35 mm medialized dome patella and the patellar component was positioned and patellar clamp applied. Excess cement was removed using Personal assistant. After adequate curing of the cement, the tourniquet was deflated after a total tourniquet time of 87 minutes. Hemostasis was achieved using electrocautery. The knee was  irrigated with copious amounts of normal saline using pulsatile lavage followed by 450 ml of Surgiphor and then suctioned dry. 20 mL of 1.3% Exparel and 60 mL of 0.25% Marcaine in 40 mL of normal saline was injected along the posterior capsule, medial and lateral gutters, and along the arthrotomy site. A 6 mm stabilized rotating platform polyethylene insert was inserted and the knee was placed through a range of motion with excellent mediolateral soft tissue balancing appreciated and excellent patellar tracking noted. 2 medium drains were placed in the wound bed and brought out through separate stab incisions. The medial parapatellar portion of the incision was reapproximated using interrupted sutures of #1 Vicryl. Subcutaneous tissue was approximated in layers using first #0 Vicryl followed #2-0 Vicryl. The skin was approximated with skin staples. A sterile dressing was applied.  The patient tolerated the procedure well and was transported to the recovery room in stable condition.    Krystal Guarnieri P. Angie Fava., M.D.

## 2023-02-14 NOTE — Interval H&P Note (Signed)
History and Physical Interval Note:  02/14/2023 6:03 AM  Krystal Weaver  has presented today for surgery, with the diagnosis of Primary osteoarthritis of left knee M17.12.  The various methods of treatment have been discussed with the patient and family. After consideration of risks, benefits and other options for treatment, the patient has consented to  Procedure(s): COMPUTER ASSISTED TOTAL KNEE ARTHROPLASTY (Left) as a surgical intervention.  The patient's history has been reviewed, patient examined, no change in status, stable for surgery.  I have reviewed the patient's chart and labs.  Questions were answered to the patient's satisfaction.     [name] P Marcy Bogosian

## 2023-02-15 ENCOUNTER — Encounter: Payer: Self-pay | Admitting: Orthopedic Surgery

## 2023-02-15 DIAGNOSIS — M1712 Unilateral primary osteoarthritis, left knee: Secondary | ICD-10-CM | POA: Diagnosis not present

## 2023-02-15 DIAGNOSIS — Z96652 Presence of left artificial knee joint: Secondary | ICD-10-CM | POA: Diagnosis not present

## 2023-02-15 MED ORDER — TRAMADOL HCL 50 MG PO TABS: 50.0000 mg | ORAL_TABLET | ORAL | 0 refills | Status: DC | PRN

## 2023-02-15 MED ORDER — OXYCODONE HCL 5 MG PO TABS: 5.0000 mg | ORAL_TABLET | ORAL | 0 refills | Status: DC | PRN

## 2023-02-15 MED ORDER — ASPIRIN 81 MG PO CHEW: 81.0000 mg | CHEWABLE_TABLET | Freq: Two times a day (BID) | ORAL | Status: DC

## 2023-02-15 MED ORDER — MELOXICAM 7.5 MG PO TABS: 7.5000 mg | ORAL_TABLET | Freq: Two times a day (BID) | ORAL | 0 refills | Status: AC

## 2023-02-15 MED ORDER — ACETAMINOPHEN 10 MG/ML IV SOLN
INTRAVENOUS | Status: AC
  Filled 2023-02-15: qty 100

## 2023-02-15 NOTE — Plan of Care (Signed)
°  Problem: Health Behavior/Discharge Planning: Goal: Ability to manage health-related needs will improve Outcome: Progressing   Problem: Clinical Measurements: Goal: Ability to maintain clinical measurements within normal limits will improve Outcome: Progressing   Problem: Nutrition: Goal: Adequate nutrition will be maintained Outcome: Progressing   Problem: Coping: Goal: Level of anxiety will decrease Outcome: Progressing   

## 2023-02-15 NOTE — TOC Initial Note (Signed)
 Transition of Care [facility]) - Initial/Assessment Note    Patient Details  Name: Krystal Weaver MRN: [id number] Date of Birth: 1955/10/27  Transition of Care [facility] CM/SW Contact:    Royanne JINNY Bernheim, RN Phone Number: 02/15/2023, 10:39 AM  Clinical Narrative:                  [facility]  Is set up for [facility] prior to surgery by Surgeons office  RW and 3 in 1 to be delivered by [employer] to the bedside       Patient Goals and CMS Choice            Expected Discharge Plan and Services         Expected Discharge Date: 02/15/23                                    Prior Living Arrangements/Services                       Activities of Daily Living   ADL Screening (condition at time of admission) Independently performs ADLs?: Yes (appropriate for developmental age) Is the patient deaf or have difficulty hearing?: No Does the patient have difficulty seeing, even when wearing glasses/contacts?: No Does the patient have difficulty concentrating, remembering, or making decisions?: No  Permission Sought/Granted                  Emotional Assessment              Admission diagnosis:  History of total knee arthroplasty, left [Z96.652] Patient Active Problem List   Diagnosis Date Noted   History of total knee arthroplasty, left 02/14/2023   Preoperative evaluation to rule out surgical contraindication 10/13/2022   OSA on CPAP 10/12/2022   Osteopenia after menopause 01/14/2022   Abnormal screening mammogram 01/14/2022   Primary osteoarthritis of left knee 12/08/2020   Melanoma of skin (HCC) 11/21/2019   Paroxysmal SVT (supraventricular tachycardia) (HCC) 10/14/2018   Tubular adenoma of colon 08/25/2017   Family history of alcoholism 08/18/2016   Varicose veins of both lower extremities with pain 11/03/2015   Chronic venous insufficiency 11/03/2015   Hyperlipidemia 02/20/2015   Bipolar disorder, unspecified (HCC) 08/24/2012   Encounter for preventive  health examination 08/24/2012   PCP:  Marylynn Verneita CROME, MD Pharmacy:   [phone], [facility] - [address] [place] [address] Phone: [phone] Fax: [phone]     Social Drivers of Health (SDOH) Social History: SDOH Screenings   Food Insecurity: No Food Insecurity (02/15/2023)  Housing: Low Risk  (02/15/2023)  Transportation Needs: No Transportation Needs (02/14/2023)  Utilities: Not At Risk (02/14/2023)  Depression (PHQ2-9): Low Risk  (10/12/2022)  Financial Resource Strain: Low Risk  (02/07/2023)   Received from [facility]  Social Connections: Socially Integrated (02/15/2023)  Stress: No Stress Concern Present (09/17/2021)  Tobacco Use: Low Risk  (02/14/2023)  Recent Concern: Tobacco Use - Medium Risk (02/07/2023)   Received from [facility]   SDOH Interventions:     Readmission Risk Interventions     No data to display

## 2023-02-15 NOTE — Progress Notes (Signed)
 DISCHARGE NOTE:  Pt is given dc instructions and voices no concerns or questions at this time. Pt is also given 2 honeycomb dressings, both TED hose on and in place, as well as medication scripts given. Pt's IV removed and wheeled down to medical mall entrance. Pt's husband provided transportation.

## 2023-02-15 NOTE — Anesthesia Postprocedure Evaluation (Signed)
Anesthesia Post Note  Patient: Krystal Weaver  Procedure(s) Performed: COMPUTER ASSISTED TOTAL KNEE ARTHROPLASTY (Left: Knee)  Patient location during evaluation: PACU Anesthesia Type: Spinal Level of consciousness: awake and alert, oriented and patient cooperative Pain management: pain level controlled Vital Signs Assessment: post-procedure vital signs reviewed and stable Respiratory status: spontaneous breathing, nonlabored ventilation and respiratory function stable Cardiovascular status: blood pressure returned to baseline and stable Postop Assessment: adequate PO intake, no headache, no backache and spinal receding Anesthetic complications: no   There were no known notable events for this encounter.   Last Vitals:  Vitals:   02/15/23 0735 02/15/23 1312  BP: 134/65 131/80  Pulse: 77 69  Resp: 15 16  Temp: 36.7 C 36.7 C  SpO2: 100% 99%    Last Pain:  Vitals:   02/15/23 1312  TempSrc: Temporal  PainSc:                  [name]

## 2023-02-15 NOTE — Evaluation (Signed)
 Occupational Therapy Evaluation Patient Details Name: Krystal Weaver MRN: [id number] DOB: 09-Feb-1955 Today's Date: 02/15/2023   History of Present Illness Pt is s/p L TKA on 02/14/23.   Clinical Impression   Ms. Guynes was seen for OT evaluation this date, POD#1 from above surgery. Pt was active and independent in all ADLs prior to surgery. She is eager to return to PLOF with less pain and improved safety and independence. Pt currently requires supervision for safety for LB dressing while in seated position due to pain and limited AROM of L knee. Pt instructed in polar care mgt, falls prevention strategies, home/routines modifications, DME/AE for LB bathing and dressing tasks, and compression stocking mgt. Handout provided to support recall/carryover of education provided. Pt return verbalizes understanding and return demos safe transfer technique during functional mobility/simulated LB dressing task. Supportive caregiver at bedside also return verbalizes understanding. Do not currently anticipate any OT needs following this hospitalization. Will DC in house. Please re-consult if additional OT needs arise during this hospital stay.         If plan is discharge home, recommend the following: Two people to help with bathing/dressing/bathroom;A little help with walking and/or transfers;Assistance with cooking/housework;Help with stairs or ramp for entrance;Assist for transportation    Functional Status Assessment     Equipment Recommendations  BSC/3in1    Recommendations for Other Services       Precautions / Restrictions Precautions Precautions: Fall Restrictions Weight Bearing Restrictions Per Provider Order: Yes LLE Weight Bearing Per Provider Order: Weight bearing as tolerated      Mobility Bed Mobility Overal bed mobility: Modified Independent Bed Mobility: Supine to Sit     Supine to sit: HOB elevated, Used rails          Transfers Overall transfer level: Needs  assistance Equipment used: Rolling walker (2 wheels) Transfers: Sit to/from Stand Sit to Stand: Supervision           General transfer comment: Good recall/carryover of safe transfer technique from prior therapy sessions.      Balance Overall balance assessment: Needs assistance Sitting-balance support: Feet unsupported, No upper extremity supported Sitting balance-Leahy Scale: Good     Standing balance support: During functional activity, Reliant on assistive device for balance, No upper extremity supported, Bilateral upper extremity supported Standing balance-Leahy Scale: Good Standing balance comment: able to static stand without UE support during functional activity.                           ADL either performed or assessed with clinical judgement   ADL Overall ADL's : Needs assistance/impaired                                       General ADL Comments: Able to doff/don bilat socks while seated EOB without AE, supervision for safety. Supportive caregiver at bedside endorses plans to assist with any/all LB ADL mgt needed upon DC. MOD I for STS t/fs/functional mobility with RW.     Vision Patient Visual Report: No change from baseline       Perception         Praxis         Pertinent Vitals/Pain Pain Assessment Pain Assessment: No/denies pain     Extremity/Trunk Assessment Upper Extremity Assessment Upper Extremity Assessment: Overall WFL for tasks assessed   Lower Extremity Assessment Lower Extremity Assessment: Defer  to PT evaluation;LLE deficits/detail LLE Deficits / Details: s/p L TKA WBAT       Communication Communication Communication: No apparent difficulties   Cognition Arousal: Alert Behavior During Therapy: WFL for tasks assessed/performed Overall Cognitive Status: Within Functional Limits for tasks assessed                                       General Comments       Exercises Other  Exercises Other Exercises: Pt educated in falls prevention strategies, safe use of AE/DME for LB ADL management, compression stocking management, polar care management, and routines modifications to support safety and fxl independence during meaningful occupations of daily life. Handout provided to support recall/carryover. Pt/caregiver both return demo/voice understanding during session.   Shoulder Instructions      Home Living Family/patient expects to be discharged to:: Private residence Living Arrangements: Spouse/significant other Available Help at Discharge: Family;Friend(s);Available 24 hours/day Type of Home: House Home Access: Level entry     Home Layout: One level     Bathroom Shower/Tub: Walk-in shower                    Prior Functioning/Environment Prior Level of Function : Independent/Modified Independent             Mobility Comments: Denies falls history in last year. ADLs Comments: Active, independent, enjoys pickle ball.        OT Problem List: Decreased coordination;Decreased range of motion;Impaired balance (sitting and/or standing);Decreased knowledge of use of DME or AE      OT Treatment/Interventions:      OT Goals(Current goals can be found in the care plan section) Acute Rehab OT Goals Patient Stated Goal: To go home OT Goal Formulation: All assessment and education complete, DC therapy Time For Goal Achievement: 02/15/23 Potential to Achieve Goals: Good  OT Frequency:      Co-evaluation              AM-PAC OT 6 Clicks Daily Activity     Outcome Measure Help from another person eating meals?: None Help from another person taking care of personal grooming?: None Help from another person toileting, which includes using toliet, bedpan, or urinal?: None Help from another person bathing (including washing, rinsing, drying)?: None Help from another person to put on and taking off regular upper body clothing?: None Help from  another person to put on and taking off regular lower body clothing?: A Little 6 Click Score: 23   End of Session Equipment Utilized During Treatment: Gait belt;Rolling walker (2 wheels) Nurse Communication: Mobility status  Activity Tolerance: Patient tolerated treatment well Patient left: Other (comment) (standing in hall with PT to carry on with session.)  OT Visit Diagnosis: Other abnormalities of gait and mobility (R26.89);Pain Pain - Right/Left: Left Pain - part of body: Knee                Time: 9052-8992 OT Time Calculation (min): 20 min Charges:  OT General Charges $OT Visit: 1 Visit OT Evaluation $OT Eval Moderate Complexity: 1 Mod OT Treatments $Self Care/Home Management : 8-22 mins  Jhonny Pelton, M.S., OTR/L 02/15/23, 11:38 AM

## 2023-02-15 NOTE — Progress Notes (Signed)
 Physical Therapy Treatment Patient Details Name: Krystal Weaver MRN: [id number] DOB: [birth year] Today's Date: 02/15/2023   History of Present Illness Pt is s/p L TKA on 02/14/23.    PT Comments  Pt received sitting EOB upon arrival with the OT.  Pt is ready for ambulation and to attempt stairs for the second time.  Pt is able to ambulate with good technique and is able to perform the stair training as well.  Pt noted that she is feeling good about her knee.  Pt noted to have increased knee flexion with ambulation as well.  Pt returned to the room with all needs met and husband in room.  Pt is formally ready for discharge to home.    If plan is discharge home, recommend the following: A little help with walking and/or transfers;A little help with bathing/dressing/bathroom;Assistance with cooking/housework;Assist for transportation   Can travel by private vehicle        Equipment Recommendations       Recommendations for Other Services       Precautions / Restrictions       Mobility  Bed Mobility               General bed mobility comments: Pt sitting on EOB with Ot upon arrival.    Transfers Overall transfer level: Needs assistance Equipment used: Rolling walker (2 wheels) Transfers: Sit to/from Stand Sit to Stand: Contact guard assist           General transfer comment: Pt able to stand without any complications and does not require cuing for proper technique.    Ambulation/Gait Ambulation/Gait assistance: Contact guard assist Gait Distance (Feet): 220 Feet Assistive device: Rolling walker (2 wheels) Gait Pattern/deviations: Step-through pattern Gait velocity: decreased     General Gait Details: Pt with improved L knee flexion while ambulating.   Stairs Stairs: Yes Stairs assistance: Contact guard assist Stair Management: Two rails, Forwards, Step to pattern Number of Stairs: 4 General stair comments: Pt with good carryover from prior  session.   Wheelchair Mobility     Tilt Bed    Modified Rankin (Stroke Patients Only)       Balance Overall balance assessment: Needs assistance Sitting-balance support: Feet unsupported, No upper extremity supported Sitting balance-Leahy Scale: Good     Standing balance support: Bilateral upper extremity supported, During functional activity, Reliant on assistive device for balance Standing balance-Leahy Scale: Fair                              Cognition Arousal: Alert Behavior During Therapy: WFL for tasks assessed/performed Overall Cognitive Status: Within Functional Limits for tasks assessed                                          Exercises      General Comments        Pertinent Vitals/Pain Pain Assessment Pain Assessment: No/denies pain    Home Living                          Prior Function            PT Goals (current goals can now be found in the care plan section) Acute Rehab PT Goals Patient Stated Goal: to get stronger and go home PT Goal Formulation: With patient  Time For Goal Achievement: 02/28/23 Potential to Achieve Goals: Good Progress towards PT goals: Progressing toward goals    Frequency    BID      PT Plan      Co-evaluation              AM-PAC PT 6 Clicks Mobility   Outcome Measure  Help needed turning from your back to your side while in a flat bed without using bedrails?: A Little Help needed moving from lying on your back to sitting on the side of a flat bed without using bedrails?: A Little Help needed moving to and from a bed to a chair (including a wheelchair)?: A Little Help needed standing up from a chair using your arms (e.g., wheelchair or bedside chair)?: A Little Help needed to walk in hospital room?: A Little Help needed climbing 3-5 steps with a railing? : A Little 6 Click Score: 18    End of Session Equipment Utilized During Treatment: Gait belt Activity  Tolerance: Patient tolerated treatment well Patient left: in bed;with call bell/phone within reach;with family/visitor present Nurse Communication: Mobility status PT Visit Diagnosis: Unsteadiness on feet (R26.81);Other abnormalities of gait and mobility (R26.89);Muscle weakness (generalized) (M62.81);Difficulty in walking, not elsewhere classified (R26.2)     Time: 1002-1020 PT Time Calculation (min) (ACUTE ONLY): 18 min  Charges:    $Therapeutic Activity: 8-22 mins PT General Charges $$ ACUTE PT VISIT: 1 Visit                     Fonda Simpers, PT, DPT Physical Therapist -   [facility]  02/15/23, 10:30 AM

## 2023-02-15 NOTE — Care Management Obs Status (Signed)
MEDICARE OBSERVATION STATUS NOTIFICATION   Patient Details  Name: Krystal Weaver MRN: [id number] Date of Birth: [birth year]   Medicare Observation Status Notification Given:  Orland Dec, CMA 02/15/2023, 9:45 AM

## 2023-02-15 NOTE — Discharge Summary (Cosign Needed)
Physician Discharge Summary  Subjective: 1 Day Post-Op Procedure(s) (LRB): COMPUTER ASSISTED TOTAL KNEE ARTHROPLASTY (Left) Patient reports pain as mild.   Patient seen in rounds with Dr. Ernest Pine. Patient is well, and has had no acute complaints or problems.  Denies any CP, SOB, N/B, fevers or chills. Patient is ready to go home  Physician Discharge Summary  Patient ID: Krystal Weaver MRN: [id number] DOB/AGE: [birth year] 69 y.o.  Admit date: 02/14/2023 Discharge date: 02/15/2023  Admission Diagnoses:  Discharge Diagnoses:  Principal Problem:   History of total knee arthroplasty, left   Discharged Condition: good  Hospital Course: Patient presented to the hospital on 02/14/2023 for an elective left total knee arthroplasty. Patient was given 1g of TXA and 2g of Ancef prior to the procedure.  She tolerated the procedure well without any complications. See procedural note below for details. Postoperatively, the patient did very well.  She was able to pass PT protocols on post-op day one without any issues. JP drain was removed without any difficulty and was intact.  She was able to void her bladder without any difficulty. Physical exam was unremarkable.  She denies any SOB, CP, N/V, fevers or chills. Vital signs are stable. Patient is stable to discharge home.  PROCEDURE:  Left total knee arthroplasty using computer-assisted navigation   SURGEON:  Jena Gauss. M.D.   ASSISTANT:  Gean Birchwood, PA-C (present and scrubbed throughout the case, critical for assistance with exposure, retraction, instrumentation, and closure)   ANESTHESIA: spinal   ESTIMATED BLOOD LOSS: 50 mL   FLUIDS REPLACED: 600 mL of crystalloid   TOURNIQUET TIME: 87 minutes   DRAINS: 2 medium Hemovac drains   SOFT TISSUE RELEASES: Anterior cruciate ligament, posterior cruciate ligament, deep medial collateral ligament, patellofemoral ligament   IMPLANTS UTILIZED: DePuy Attune size 6N posterior stabilized  femoral component (cemented), size 5 rotating platform tibial component (cemented), 35 mm medialized dome patella (cemented), and a 6 mm stabilized rotating platform polyethylene insert.  Treatments: None  Discharge Exam: Blood pressure 134/65, pulse 77, temperature 98 F (36.7 C), temperature source Oral, resp. rate 15, height 5\' 5"  (1.651 m), weight 59.9 kg, SpO2 100%.   Disposition: Home   Allergies as of 02/15/2023   No Known Allergies      Medication List     TAKE these medications    ascorbic acid 500 MG tablet Commonly known as: VITAMIN C Take 500 mg by mouth daily.   aspirin 81 MG chewable tablet Chew 1 tablet (81 mg total) by mouth 2 (two) times daily.   docusate sodium 100 MG capsule Commonly known as: COLACE Take 100 mg by mouth at bedtime.   fish oil-omega-3 fatty acids 1000 MG capsule Take 1 g by mouth daily.   lamoTRIgine 150 MG tablet Commonly known as: LAMICTAL Take 150 mg by mouth in the morning.   MAG GLYCINATE PO Take 400 mg by mouth at bedtime. 200 mg each   meloxicam 7.5 MG tablet Commonly known as: MOBIC Take 1 tablet (7.5 mg total) by mouth 2 (two) times daily.   metoprolol tartrate 25 MG tablet Commonly known as: LOPRESSOR TAKE 1 TABLET (25 MG TOTAL) BY MOUTH 2 (TWO) TIMES DAILY. AS NEEDED FOR PSVT What changed:  when to take this reasons to take this additional instructions   multivitamin with minerals tablet Take 1 tablet by mouth daily.   oxyCODONE 5 MG immediate release tablet Commonly known as: Oxy IR/ROXICODONE Take 1 tablet (5 mg total) by mouth every  4 (four) hours as needed for moderate pain (pain score 4-6) (pain score 4-6).   PROBIOTIC PO Take 1 capsule by mouth daily.   sennosides-docusate sodium 8.6-50 MG tablet Commonly known as: SENOKOT-S Take 1 tablet by mouth in the morning.   traMADol 50 MG tablet Commonly known as: ULTRAM Take 1-2 tablets (50-100 mg total) by mouth every 4 (four) hours as needed for  moderate pain (pain score 4-6).   traZODone 50 MG tablet Commonly known as: DESYREL Take 50-100 mg by mouth at bedtime.   Vitamin D3 25 MCG (1000 UT) Caps Take 1,000 Units by mouth 2 (two) times daily.               Durable Medical Equipment  (From admission, onward)           Start     Ordered   02/14/23 1114  DME Walker rolling  Once       Question:  Patient needs a walker to treat with the following condition  Answer:  Total knee replacement status   02/14/23 1113   02/14/23 1114  DME Bedside commode  Once       Comments: Patient is not able to walk the distance required to go the bathroom, or he/she is unable to safely negotiate stairs required to access the bathroom.  A 3in1 BSC will alleviate this problem  Question:  Patient needs a bedside commode to treat with the following condition  Answer:  Total knee replacement status   02/14/23 1113            Follow-up Information     Rayburn Go, PA-C Follow up on 03/01/2023.   Specialty: Orthopedic Surgery Why: 9:45am Contact information: [address] [place] [address] [phone]         Donato Heinz, MD Follow up on 03/29/2023.   Specialty: Orthopedic Surgery Why: at 2:30pm Contact information: [address] [facility] [place] [address] [phone]                 Signed: Gean Birchwood 02/15/2023, 8:54 AM   Objective: Vital signs in last 24 hours: Temp:  [97.6 F (36.4 C)-98.3 F (36.8 C)] 98 F (36.7 C) (02/04 0735) Pulse Rate:  [67-93] 77 (02/04 0735) Resp:  [13-18] 15 (02/04 0735) BP: (98-134)/(54-76) 134/65 (02/04 0735) SpO2:  [97 %-100 %] 100 % (02/04 0735)  Intake/Output from previous day:  Intake/Output Summary (Last 24 hours) at 02/15/2023 0854 Last data filed at 02/15/2023 0300 Gross per 24 hour  Intake 524.14 ml  Output 510 ml  Net 14.14 ml    Intake/Output this shift: No intake/output data recorded.  Labs: No results for  input(s): "HGB" in the last 72 hours. No results for input(s): "WBC", "RBC", "HCT", "PLT" in the last 72 hours. No results for input(s): "NA", "K", "CL", "CO2", "BUN", "CREATININE", "GLUCOSE", "CALCIUM" in the last 72 hours. No results for input(s): "LABPT", "INR" in the last 72 hours.  EXAM: General - Patient is Alert, Appropriate, and Oriented Extremity - Neurologically intact ABD soft Neurovascular intact Sensation intact distally Intact pulses distally Dorsiflexion/Plantar flexion intact No cellulitis present Compartment soft Dressing - dressing C/D/I and no drainage Motor Function - intact, moving foot and toes well on exam. JP Drain pulled without difficulty. Intact  Assessment/Plan: 1 Day Post-Op Procedure(s) (LRB): COMPUTER ASSISTED TOTAL KNEE ARTHROPLASTY (Left) Procedure(s) (LRB): COMPUTER ASSISTED TOTAL KNEE ARTHROPLASTY (Left) Past Medical History:  Diagnosis Date   Bipolar 2 disorder (  HCC)    Cancer (HCC) 11/2016   melanoma- rt lower leg   Degenerative arthritis of left knee 01/2023   Depression    Bi polar type 2   Dysplastic nevus 06/28/2018   R medial antecubital/mod, L prox antecubital/mod   Dysplastic nevus 06/20/2019   Left proximal post. lat. thigh. Moderate to severe atypia, inflamed; close to margin. Excised: 07/31/2019, margins free.   Melanoma (HCC) 11/29/2017   right calf/in situ/WLE   Mixed hyperlipidemia    OSA on CPAP    Osteopenia after menopause    Paroxysmal SVT (supraventricular tachycardia) (HCC)    Tubular adenoma of colon    Varicella zoster 08/2018   Varicose veins of both lower extremities with pain    Venous insufficiency of both lower extremities    Principal Problem:   History of total knee arthroplasty, left  Estimated body mass index is 21.97 kg/m as calculated from the following:   Height as of this encounter: 5\' 5"  (1.651 m).   Weight as of this encounter: 59.9 kg.  Patient will continue to work with physical therapy at  home with ROM, gait and strength of her left lower extremity   Discussed with the patient continuing to utilize Polar Care   Patient will use bone foam in 20-30 minute intervals   Patient will wear TED hose bilaterally to help prevent DVT and clot formation   Discussed the Aquacel bandage.  This bandage will stay in place 7 days postoperatively.  Can be replaced with honeycomb bandages that will be sent home with the patient   Discussed sending the patient home with tramadol and oxycodone for as needed pain management.  Patient will also be sent home with Meloxicam to help with swelling and inflammation.  Patient will take an 81 mg aspirin twice daily for DVT prophylaxis   JP drain removed without difficulty, intact   Weight-Bearing as tolerated to left leg   Patient will follow-up with [facility] orthopedics in 2 weeks for staple removal and reevaluation  Diet - Regular diet Follow up - in 2 weeks Activity - WBAT Disposition - Home Condition Upon Discharge - Good DVT Prophylaxis - Aspirin and TED hose  Danise Edge, PA-C Orthopaedic Surgery 02/15/2023, 8:54 AM

## 2023-02-15 NOTE — Progress Notes (Signed)
Subjective: 1 Day Post-Op Procedure(s) (LRB): COMPUTER ASSISTED TOTAL KNEE ARTHROPLASTY (Left) Patient reports pain as mild.   Patient seen in rounds with Dr. Ernest Pine. Patient is well, and has had no acute complaints or problems. Denies any CP, SOB, N/V, fevers or chills We will start therapy today.  Plan is to go Home after hospital stay.  Objective: Vital signs in last 24 hours: Temp:  [97.6 F (36.4 C)-98.3 F (36.8 C)] 98 F (36.7 C) (02/04 0735) Pulse Rate:  [67-93] 77 (02/04 0735) Resp:  [13-18] 15 (02/04 0735) BP: (98-134)/(54-76) 134/65 (02/04 0735) SpO2:  [97 %-100 %] 100 % (02/04 0735)  Intake/Output from previous day:  Intake/Output Summary (Last 24 hours) at 02/15/2023 0849 Last data filed at 02/15/2023 0300 Gross per 24 hour  Intake 524.14 ml  Output 510 ml  Net 14.14 ml    Intake/Output this shift: No intake/output data recorded.  Labs: No results for input(s): "HGB" in the last 72 hours. No results for input(s): "WBC", "RBC", "HCT", "PLT" in the last 72 hours. No results for input(s): "NA", "K", "CL", "CO2", "BUN", "CREATININE", "GLUCOSE", "CALCIUM" in the last 72 hours. No results for input(s): "LABPT", "INR" in the last 72 hours.  EXAM General - Patient is Alert, Appropriate, and Oriented Extremity - Neurologically intact ABD soft Neurovascular intact Sensation intact distally Intact pulses distally Dorsiflexion/Plantar flexion intact No cellulitis present Compartment soft Dressing - dressing C/D/I and no drainage Motor Function - intact, moving foot and toes well on exam. JP Drain pulled without difficulty. Intact  Past Medical History:  Diagnosis Date   Bipolar 2 disorder (HCC)    Cancer (HCC) 11/2016   melanoma- rt lower leg   Degenerative arthritis of left knee 01/2023   Depression    Bi polar type 2   Dysplastic nevus 06/28/2018   R medial antecubital/mod, L prox antecubital/mod   Dysplastic nevus 06/20/2019   Left proximal post. lat.  thigh. Moderate to severe atypia, inflamed; close to margin. Excised: 07/31/2019, margins free.   Melanoma (HCC) 11/29/2017   right calf/in situ/WLE   Mixed hyperlipidemia    OSA on CPAP    Osteopenia after menopause    Paroxysmal SVT (supraventricular tachycardia) (HCC)    Tubular adenoma of colon    Varicella zoster 08/2018   Varicose veins of both lower extremities with pain    Venous insufficiency of both lower extremities     Assessment/Plan: 1 Day Post-Op Procedure(s) (LRB): COMPUTER ASSISTED TOTAL KNEE ARTHROPLASTY (Left) Principal Problem:   History of total knee arthroplasty, left  Estimated body mass index is 21.97 kg/m as calculated from the following:   Height as of this encounter: 5\' 5"  (1.651 m).   Weight as of this encounter: 59.9 kg. Advance diet Up with therapy  Patient will continue to work with physical therapy to pass postoperative PT protocols, ROM and strengthening  Discussed with the patient continuing to utilize Polar Care  Patient will use bone foam in 20-30 minute intervals  Patient will wear TED hose bilaterally to help prevent DVT and clot formation  Discussed the Aquacel bandage.  This bandage will stay in place 7 days postoperatively.  Can be replaced with honeycomb bandages that will be sent home with the patient  Discussed sending the patient home with tramadol and oxycodone for as needed pain management.  Patient will also be sent home with Meloxicam to help with swelling and inflammation.  Patient will take an 81 mg aspirin twice daily for DVT prophylaxis  JP drain removed without difficulty, intact  Weight-Bearing as tolerated to left leg  Patient will follow-up with [facility] clinic orthopedics in 2 weeks for staple removal and reevaluation  Rayburn Go, PA-C [facility] Orthopaedics 02/15/2023, 8:49 AM

## 2023-02-15 NOTE — Plan of Care (Signed)
  Problem: Clinical Measurements: Goal: Respiratory complications will improve Outcome: Progressing   Problem: Activity: Goal: Risk for activity intolerance will decrease Outcome: Progressing   Problem: Nutrition: Goal: Adequate nutrition will be maintained Outcome: Progressing   Problem: Coping: Goal: Level of anxiety will decrease Outcome: Progressing   Problem: Elimination: Goal: Will not experience complications related to urinary retention Outcome: Progressing   Problem: Pain Managment: Goal: General experience of comfort will improve and/or be controlled Outcome: Progressing   Problem: Pain Management: Goal: Pain level will decrease with appropriate interventions Outcome: Progressing

## 2023-02-16 DIAGNOSIS — E782 Mixed hyperlipidemia: Secondary | ICD-10-CM | POA: Diagnosis not present

## 2023-02-16 DIAGNOSIS — Z96652 Presence of left artificial knee joint: Secondary | ICD-10-CM | POA: Diagnosis not present

## 2023-02-16 DIAGNOSIS — Z7982 Long term (current) use of aspirin: Secondary | ICD-10-CM | POA: Diagnosis not present

## 2023-02-16 DIAGNOSIS — Z860101 Personal history of adenomatous and serrated colon polyps: Secondary | ICD-10-CM | POA: Diagnosis not present

## 2023-02-16 DIAGNOSIS — F3181 Bipolar II disorder: Secondary | ICD-10-CM | POA: Diagnosis not present

## 2023-02-16 DIAGNOSIS — Z85828 Personal history of other malignant neoplasm of skin: Secondary | ICD-10-CM | POA: Diagnosis not present

## 2023-02-16 DIAGNOSIS — Z791 Long term (current) use of non-steroidal anti-inflammatories (NSAID): Secondary | ICD-10-CM | POA: Diagnosis not present

## 2023-02-16 DIAGNOSIS — G4733 Obstructive sleep apnea (adult) (pediatric): Secondary | ICD-10-CM | POA: Diagnosis not present

## 2023-02-16 DIAGNOSIS — I83813 Varicose veins of bilateral lower extremities with pain: Secondary | ICD-10-CM | POA: Diagnosis not present

## 2023-02-16 DIAGNOSIS — I471 Supraventricular tachycardia, unspecified: Secondary | ICD-10-CM | POA: Diagnosis not present

## 2023-02-16 DIAGNOSIS — Z471 Aftercare following joint replacement surgery: Secondary | ICD-10-CM | POA: Diagnosis not present

## 2023-02-16 DIAGNOSIS — R32 Unspecified urinary incontinence: Secondary | ICD-10-CM | POA: Diagnosis not present

## 2023-02-18 DIAGNOSIS — Z471 Aftercare following joint replacement surgery: Secondary | ICD-10-CM | POA: Diagnosis not present

## 2023-02-18 DIAGNOSIS — Z96652 Presence of left artificial knee joint: Secondary | ICD-10-CM | POA: Diagnosis not present

## 2023-02-18 DIAGNOSIS — Z791 Long term (current) use of non-steroidal anti-inflammatories (NSAID): Secondary | ICD-10-CM | POA: Diagnosis not present

## 2023-02-18 DIAGNOSIS — I471 Supraventricular tachycardia, unspecified: Secondary | ICD-10-CM | POA: Diagnosis not present

## 2023-02-18 DIAGNOSIS — Z7982 Long term (current) use of aspirin: Secondary | ICD-10-CM | POA: Diagnosis not present

## 2023-02-18 DIAGNOSIS — I83813 Varicose veins of bilateral lower extremities with pain: Secondary | ICD-10-CM | POA: Diagnosis not present

## 2023-02-18 DIAGNOSIS — Z85828 Personal history of other malignant neoplasm of skin: Secondary | ICD-10-CM | POA: Diagnosis not present

## 2023-02-18 DIAGNOSIS — Z860101 Personal history of adenomatous and serrated colon polyps: Secondary | ICD-10-CM | POA: Diagnosis not present

## 2023-02-18 DIAGNOSIS — F3181 Bipolar II disorder: Secondary | ICD-10-CM | POA: Diagnosis not present

## 2023-02-18 DIAGNOSIS — G4733 Obstructive sleep apnea (adult) (pediatric): Secondary | ICD-10-CM | POA: Diagnosis not present

## 2023-02-18 DIAGNOSIS — R32 Unspecified urinary incontinence: Secondary | ICD-10-CM | POA: Diagnosis not present

## 2023-02-18 DIAGNOSIS — E782 Mixed hyperlipidemia: Secondary | ICD-10-CM | POA: Diagnosis not present

## 2023-02-21 DIAGNOSIS — I471 Supraventricular tachycardia, unspecified: Secondary | ICD-10-CM | POA: Diagnosis not present

## 2023-02-21 DIAGNOSIS — Z860101 Personal history of adenomatous and serrated colon polyps: Secondary | ICD-10-CM | POA: Diagnosis not present

## 2023-02-21 DIAGNOSIS — Z96652 Presence of left artificial knee joint: Secondary | ICD-10-CM | POA: Diagnosis not present

## 2023-02-21 DIAGNOSIS — Z471 Aftercare following joint replacement surgery: Secondary | ICD-10-CM | POA: Diagnosis not present

## 2023-02-21 DIAGNOSIS — Z7982 Long term (current) use of aspirin: Secondary | ICD-10-CM | POA: Diagnosis not present

## 2023-02-21 DIAGNOSIS — G4733 Obstructive sleep apnea (adult) (pediatric): Secondary | ICD-10-CM | POA: Diagnosis not present

## 2023-02-21 DIAGNOSIS — I83813 Varicose veins of bilateral lower extremities with pain: Secondary | ICD-10-CM | POA: Diagnosis not present

## 2023-02-21 DIAGNOSIS — Z85828 Personal history of other malignant neoplasm of skin: Secondary | ICD-10-CM | POA: Diagnosis not present

## 2023-02-21 DIAGNOSIS — R32 Unspecified urinary incontinence: Secondary | ICD-10-CM | POA: Diagnosis not present

## 2023-02-21 DIAGNOSIS — Z791 Long term (current) use of non-steroidal anti-inflammatories (NSAID): Secondary | ICD-10-CM | POA: Diagnosis not present

## 2023-02-21 DIAGNOSIS — E782 Mixed hyperlipidemia: Secondary | ICD-10-CM | POA: Diagnosis not present

## 2023-02-21 DIAGNOSIS — F3181 Bipolar II disorder: Secondary | ICD-10-CM | POA: Diagnosis not present

## 2023-02-23 DIAGNOSIS — Z791 Long term (current) use of non-steroidal anti-inflammatories (NSAID): Secondary | ICD-10-CM | POA: Diagnosis not present

## 2023-02-23 DIAGNOSIS — E782 Mixed hyperlipidemia: Secondary | ICD-10-CM | POA: Diagnosis not present

## 2023-02-23 DIAGNOSIS — I83813 Varicose veins of bilateral lower extremities with pain: Secondary | ICD-10-CM | POA: Diagnosis not present

## 2023-02-23 DIAGNOSIS — I471 Supraventricular tachycardia, unspecified: Secondary | ICD-10-CM | POA: Diagnosis not present

## 2023-02-23 DIAGNOSIS — Z7982 Long term (current) use of aspirin: Secondary | ICD-10-CM | POA: Diagnosis not present

## 2023-02-23 DIAGNOSIS — Z860101 Personal history of adenomatous and serrated colon polyps: Secondary | ICD-10-CM | POA: Diagnosis not present

## 2023-02-23 DIAGNOSIS — Z85828 Personal history of other malignant neoplasm of skin: Secondary | ICD-10-CM | POA: Diagnosis not present

## 2023-02-23 DIAGNOSIS — F3181 Bipolar II disorder: Secondary | ICD-10-CM | POA: Diagnosis not present

## 2023-02-23 DIAGNOSIS — G4733 Obstructive sleep apnea (adult) (pediatric): Secondary | ICD-10-CM | POA: Diagnosis not present

## 2023-02-23 DIAGNOSIS — Z96652 Presence of left artificial knee joint: Secondary | ICD-10-CM | POA: Diagnosis not present

## 2023-02-23 DIAGNOSIS — Z471 Aftercare following joint replacement surgery: Secondary | ICD-10-CM | POA: Diagnosis not present

## 2023-02-23 DIAGNOSIS — R32 Unspecified urinary incontinence: Secondary | ICD-10-CM | POA: Diagnosis not present

## 2023-02-25 DIAGNOSIS — I83813 Varicose veins of bilateral lower extremities with pain: Secondary | ICD-10-CM | POA: Diagnosis not present

## 2023-02-25 DIAGNOSIS — Z791 Long term (current) use of non-steroidal anti-inflammatories (NSAID): Secondary | ICD-10-CM | POA: Diagnosis not present

## 2023-02-25 DIAGNOSIS — I471 Supraventricular tachycardia, unspecified: Secondary | ICD-10-CM | POA: Diagnosis not present

## 2023-02-25 DIAGNOSIS — Z471 Aftercare following joint replacement surgery: Secondary | ICD-10-CM | POA: Diagnosis not present

## 2023-02-25 DIAGNOSIS — Z7982 Long term (current) use of aspirin: Secondary | ICD-10-CM | POA: Diagnosis not present

## 2023-02-25 DIAGNOSIS — R32 Unspecified urinary incontinence: Secondary | ICD-10-CM | POA: Diagnosis not present

## 2023-02-25 DIAGNOSIS — G4733 Obstructive sleep apnea (adult) (pediatric): Secondary | ICD-10-CM | POA: Diagnosis not present

## 2023-02-25 DIAGNOSIS — E782 Mixed hyperlipidemia: Secondary | ICD-10-CM | POA: Diagnosis not present

## 2023-02-25 DIAGNOSIS — F3181 Bipolar II disorder: Secondary | ICD-10-CM | POA: Diagnosis not present

## 2023-02-25 DIAGNOSIS — Z85828 Personal history of other malignant neoplasm of skin: Secondary | ICD-10-CM | POA: Diagnosis not present

## 2023-02-25 DIAGNOSIS — Z860101 Personal history of adenomatous and serrated colon polyps: Secondary | ICD-10-CM | POA: Diagnosis not present

## 2023-02-25 DIAGNOSIS — Z96652 Presence of left artificial knee joint: Secondary | ICD-10-CM | POA: Diagnosis not present

## 2023-02-28 DIAGNOSIS — E782 Mixed hyperlipidemia: Secondary | ICD-10-CM | POA: Diagnosis not present

## 2023-02-28 DIAGNOSIS — Z7982 Long term (current) use of aspirin: Secondary | ICD-10-CM | POA: Diagnosis not present

## 2023-02-28 DIAGNOSIS — I471 Supraventricular tachycardia, unspecified: Secondary | ICD-10-CM | POA: Diagnosis not present

## 2023-02-28 DIAGNOSIS — R32 Unspecified urinary incontinence: Secondary | ICD-10-CM | POA: Diagnosis not present

## 2023-02-28 DIAGNOSIS — Z791 Long term (current) use of non-steroidal anti-inflammatories (NSAID): Secondary | ICD-10-CM | POA: Diagnosis not present

## 2023-02-28 DIAGNOSIS — F3181 Bipolar II disorder: Secondary | ICD-10-CM | POA: Diagnosis not present

## 2023-02-28 DIAGNOSIS — Z96652 Presence of left artificial knee joint: Secondary | ICD-10-CM | POA: Diagnosis not present

## 2023-02-28 DIAGNOSIS — I83813 Varicose veins of bilateral lower extremities with pain: Secondary | ICD-10-CM | POA: Diagnosis not present

## 2023-02-28 DIAGNOSIS — Z85828 Personal history of other malignant neoplasm of skin: Secondary | ICD-10-CM | POA: Diagnosis not present

## 2023-02-28 DIAGNOSIS — G4733 Obstructive sleep apnea (adult) (pediatric): Secondary | ICD-10-CM | POA: Diagnosis not present

## 2023-02-28 DIAGNOSIS — Z860101 Personal history of adenomatous and serrated colon polyps: Secondary | ICD-10-CM | POA: Diagnosis not present

## 2023-02-28 DIAGNOSIS — Z471 Aftercare following joint replacement surgery: Secondary | ICD-10-CM | POA: Diagnosis not present

## 2023-03-01 DIAGNOSIS — M25562 Pain in left knee: Secondary | ICD-10-CM | POA: Diagnosis not present

## 2023-03-01 DIAGNOSIS — Z96652 Presence of left artificial knee joint: Secondary | ICD-10-CM | POA: Diagnosis not present

## 2023-03-04 DIAGNOSIS — M25562 Pain in left knee: Secondary | ICD-10-CM | POA: Diagnosis not present

## 2023-03-04 DIAGNOSIS — Z96652 Presence of left artificial knee joint: Secondary | ICD-10-CM | POA: Diagnosis not present

## 2023-03-08 DIAGNOSIS — Z96652 Presence of left artificial knee joint: Secondary | ICD-10-CM | POA: Diagnosis not present

## 2023-03-11 DIAGNOSIS — M25562 Pain in left knee: Secondary | ICD-10-CM | POA: Diagnosis not present

## 2023-03-11 DIAGNOSIS — Z96652 Presence of left artificial knee joint: Secondary | ICD-10-CM | POA: Diagnosis not present

## 2023-03-11 DIAGNOSIS — F3132 Bipolar disorder, current episode depressed, moderate: Secondary | ICD-10-CM | POA: Diagnosis not present

## 2023-03-15 DIAGNOSIS — Z96652 Presence of left artificial knee joint: Secondary | ICD-10-CM | POA: Diagnosis not present

## 2023-03-15 DIAGNOSIS — M25562 Pain in left knee: Secondary | ICD-10-CM | POA: Diagnosis not present

## 2023-03-17 DIAGNOSIS — M25562 Pain in left knee: Secondary | ICD-10-CM | POA: Diagnosis not present

## 2023-03-17 DIAGNOSIS — Z96652 Presence of left artificial knee joint: Secondary | ICD-10-CM | POA: Diagnosis not present

## 2023-03-21 DIAGNOSIS — Z96652 Presence of left artificial knee joint: Secondary | ICD-10-CM | POA: Diagnosis not present

## 2023-03-21 DIAGNOSIS — M25662 Stiffness of left knee, not elsewhere classified: Secondary | ICD-10-CM | POA: Diagnosis not present

## 2023-03-23 DIAGNOSIS — G4733 Obstructive sleep apnea (adult) (pediatric): Secondary | ICD-10-CM | POA: Diagnosis not present

## 2023-03-24 DIAGNOSIS — Z96652 Presence of left artificial knee joint: Secondary | ICD-10-CM | POA: Diagnosis not present

## 2023-03-29 DIAGNOSIS — Z96652 Presence of left artificial knee joint: Secondary | ICD-10-CM | POA: Diagnosis not present

## 2023-04-04 DIAGNOSIS — F3132 Bipolar disorder, current episode depressed, moderate: Secondary | ICD-10-CM | POA: Diagnosis not present

## 2023-05-02 DIAGNOSIS — F3132 Bipolar disorder, current episode depressed, moderate: Secondary | ICD-10-CM | POA: Diagnosis not present

## 2023-05-09 DIAGNOSIS — F3181 Bipolar II disorder: Secondary | ICD-10-CM | POA: Diagnosis not present

## 2023-05-27 DIAGNOSIS — M7918 Myalgia, other site: Secondary | ICD-10-CM | POA: Diagnosis not present

## 2023-05-27 DIAGNOSIS — M9904 Segmental and somatic dysfunction of sacral region: Secondary | ICD-10-CM | POA: Diagnosis not present

## 2023-05-27 DIAGNOSIS — M9901 Segmental and somatic dysfunction of cervical region: Secondary | ICD-10-CM | POA: Diagnosis not present

## 2023-05-27 DIAGNOSIS — M9903 Segmental and somatic dysfunction of lumbar region: Secondary | ICD-10-CM | POA: Diagnosis not present

## 2023-05-27 DIAGNOSIS — M542 Cervicalgia: Secondary | ICD-10-CM | POA: Diagnosis not present

## 2023-05-27 DIAGNOSIS — M5451 Vertebrogenic low back pain: Secondary | ICD-10-CM | POA: Diagnosis not present

## 2023-06-08 DIAGNOSIS — F3132 Bipolar disorder, current episode depressed, moderate: Secondary | ICD-10-CM | POA: Diagnosis not present

## 2023-07-29 ENCOUNTER — Ambulatory Visit: Payer: Self-pay

## 2023-07-29 NOTE — Telephone Encounter (Signed)
 FYI Only or Action Required?: FYI only for provider.  Patient was last seen in primary care on 10/12/2022 by Marylynn Verneita CROME, MD.  Called Nurse Triage reporting Cyst.  Symptoms began yesterday.  Interventions attempted: Rest, hydration, or home remedies.  Symptoms are: gradually improving.  Triage Disposition: See PCP Within 2 Weeks  Patient/caregiver understands and will follow disposition?: Yes      Copied from CRM [id number]. Topic: Clinical - Red Word Triage >> Jul 29, 2023  8:09 AM Robinson H wrote: [facility] that prompted transfer to Nurse Triage: Santina to bathroom blood on tissue, may have felt bulge down there Reason for Disposition  All other vaginal symptoms  (Exceptions: Feels like prior yeast infection, minor abrasion, mild rash < 24 hour duration, mild itching, vaginal dryness during sex.)    Possible cyst that has ruptured and is bleeding/drainage  Answer Assessment - Initial Assessment Questions 1. SYMPTOM: What's the main symptom you're concerned about? (e.g., pain, itching, dryness)     bleeding 2. LOCATION: Where is the  *No Answer* located? (e.g., inside/outside, left/right)     *No Answer* 3. ONSET: When did the  *No Answer*  start?     yesterday 4. PAIN: Is there any pain? If Yes, ask: How bad is it? (Scale: 1-10; mild, moderate, severe)     no 5. ITCHING: Is there any itching? If Yes, ask: How bad is it? (Scale: 1-10; mild, moderate, severe)     no 6. CAUSE: What do you think is causing the discharge? Have you had the same problem before? What happened then?     Possibly cyst 7. OTHER SYMPTOMS: Do you have any other symptoms? (e.g., fever, itching, vaginal bleeding, pain with urination, injury to genital area, vaginal foreign body)     Bulge/lump possible cyst  8. PREGNANCY: Is there any chance you are pregnant? When was your last menstrual period?     na  Protocols used: Vaginal Symptoms-A-AH

## 2023-07-29 NOTE — Telephone Encounter (Signed)
 Patient is seeing gyn. She thinks she has a cystocele. She says she is not having any worsening issues. She says it has actually gotten better. She canceled her appt with Dr Narendra since she is see gyn next week. She will be evaluated sooner if any acute or worsening symptoms.

## 2023-08-03 ENCOUNTER — Ambulatory Visit: Admitting: Internal Medicine

## 2023-08-04 DIAGNOSIS — N950 Postmenopausal bleeding: Secondary | ICD-10-CM | POA: Diagnosis not present

## 2023-08-04 DIAGNOSIS — N819 Female genital prolapse, unspecified: Secondary | ICD-10-CM | POA: Diagnosis not present

## 2023-08-04 DIAGNOSIS — Z124 Encounter for screening for malignant neoplasm of cervix: Secondary | ICD-10-CM | POA: Diagnosis not present

## 2023-08-17 DIAGNOSIS — N859 Noninflammatory disorder of uterus, unspecified: Secondary | ICD-10-CM | POA: Diagnosis not present

## 2023-08-17 DIAGNOSIS — N858 Other specified noninflammatory disorders of uterus: Secondary | ICD-10-CM | POA: Diagnosis not present

## 2023-08-17 DIAGNOSIS — N8110 Cystocele, unspecified: Secondary | ICD-10-CM | POA: Diagnosis not present

## 2023-08-17 DIAGNOSIS — N950 Postmenopausal bleeding: Secondary | ICD-10-CM | POA: Diagnosis not present

## 2023-08-17 DIAGNOSIS — N882 Stricture and stenosis of cervix uteri: Secondary | ICD-10-CM | POA: Diagnosis not present

## 2023-08-17 DIAGNOSIS — N3946 Mixed incontinence: Secondary | ICD-10-CM | POA: Diagnosis not present

## 2023-09-06 DIAGNOSIS — F3132 Bipolar disorder, current episode depressed, moderate: Secondary | ICD-10-CM | POA: Diagnosis not present

## 2023-09-22 ENCOUNTER — Ambulatory Visit: Payer: Medicare HMO | Admitting: Dermatology

## 2023-09-27 DIAGNOSIS — G4733 Obstructive sleep apnea (adult) (pediatric): Secondary | ICD-10-CM | POA: Diagnosis not present

## 2023-09-29 ENCOUNTER — Ambulatory Visit: Attending: Obstetrics and Gynecology

## 2023-09-29 ENCOUNTER — Other Ambulatory Visit: Payer: Self-pay

## 2023-09-29 DIAGNOSIS — R2689 Other abnormalities of gait and mobility: Secondary | ICD-10-CM | POA: Diagnosis present

## 2023-09-29 DIAGNOSIS — N3946 Mixed incontinence: Secondary | ICD-10-CM | POA: Insufficient documentation

## 2023-09-29 DIAGNOSIS — M6281 Muscle weakness (generalized): Secondary | ICD-10-CM | POA: Insufficient documentation

## 2023-09-29 NOTE — Therapy (Addendum)
 OUTPATIENT PHYSICAL THERAPY FEMALE PELVIC EVALUATION   Patient Name: Krystal Weaver MRN: [id number] DOB:04-Sep-1955, 68 y.o., female Today's Date: 09/29/2023  END OF SESSION:  PT End of Session - 09/29/23 1411     Visit Number 1    Number of Visits 9    Date for Recertification  11/28/23    Authorization Type [employer]    Progress Note Due on Visit 10    PT Start Time 1402    PT Stop Time 1442    PT Time Calculation (min) 40 min    Activity Tolerance Patient tolerated treatment well    Behavior During Therapy [facility] for tasks assessed/performed          Past Medical History:  Diagnosis Date   Bipolar 2 disorder (HCC)    Cancer (HCC) 11/2016   melanoma- rt lower leg   Degenerative arthritis of left knee 01/2023   Depression    Bi polar type 2   Dysplastic nevus 06/28/2018   R medial antecubital/mod, L prox antecubital/mod   Dysplastic nevus 06/20/2019   Left proximal post. lat. thigh. Moderate to severe atypia, inflamed; close to margin. Excised: 07/31/2019, margins free.   Melanoma (HCC) 11/29/2017   right calf/in situ/WLE   Mixed hyperlipidemia    OSA on CPAP    Osteopenia after menopause    Paroxysmal SVT (supraventricular tachycardia) (HCC)    Tubular adenoma of colon    Varicella zoster 08/2018   Varicose veins of both lower extremities with pain    Venous insufficiency of both lower extremities    Past Surgical History:  Procedure Laterality Date   BREAST CYST ASPIRATION Right 2004   Negative   BREAST EXCISIONAL BIOPSY Left 1982   Negative - Excisional   COLONOSCOPY  11/22/2005   COLONOSCOPY  03/03/2016   COLONOSCOPY  03/26/2021   DILATION AND CURETTAGE OF UTERUS  1980   KNEE ARTHROPLASTY Left 02/14/2023   Procedure: COMPUTER ASSISTED TOTAL KNEE ARTHROPLASTY;  Surgeon: Mardee Lynwood SQUIBB, MD;  Location: [facility] ORS;  Service: Orthopedics;  Laterality: Left;   MELANOMA EXCISION Right 11/2017   calf (in office)   Patient Active Problem List   Diagnosis  Date Noted   History of total knee arthroplasty, left 02/14/2023   Preoperative evaluation to rule out surgical contraindication 10/13/2022   OSA on CPAP 10/12/2022   Osteopenia after menopause 01/14/2022   Abnormal screening mammogram 01/14/2022   Primary osteoarthritis of left knee 12/08/2020   Melanoma of skin (HCC) 11/21/2019   Paroxysmal SVT (supraventricular tachycardia) (HCC) 10/14/2018   Tubular adenoma of colon 08/25/2017   Family history of alcoholism 08/18/2016   Varicose veins of both lower extremities with pain 11/03/2015   Chronic venous insufficiency 11/03/2015   Hyperlipidemia 02/20/2015   Bipolar disorder, unspecified (HCC) 08/24/2012   Encounter for preventive health examination 08/24/2012    PCP: Dr. Verneita Kettering  REFERRING PROVIDER: Dr. Heather Penton   REFERRING DIAG: Cystocele and mixed urinary incontinence  THERAPY DIAG:  Urinary incontinence, mixed  Other abnormalities of gait and mobility  Muscle weakness (generalized)  Rationale for Evaluation and Treatment: Rehabilitation  ONSET DATE: 08/24/23 referral date but began approx. Mid-July when she felt the prolapse (maybe grade 1)   SUBJECTIVE:  SUBJECTIVE STATEMENT: URINARY FUNCTION: pt voids approx. Every 3-4 hours. Pt feels like she fully empties bladder. She felt her bladder in vaginal canal in July but not since (maybe grade 1 per GYN). Pt sometimes gets up at night to void (0-1/night). Pt reported she experienced urgency getting to the bathroom and had some dribble but not gushing. No longer wearing pads after she began performing PFM exercises (cat/cow, bridging, yoga). She's cut back on lifting until she sees PFPT. Pt denied pain with voiding. BOWEL FUNCTION: every day, sometimes twice a day. Types 3-4, 75% of the  time. Pt denied pain, fully empties rectum and no hemorrhoids.  CORE STABILITY: pt denied MVAs, falls, or illness and surgeries. No chronic back pain. L TKA was first surgery. SEXUAL FUNCTION: pt denied pain with intercourse, OBGYN, or tampon insertion. Climax with clitoral stimulation.   Fluid intake: pt starts with coffee (decaf) and a smoothie in the morning, pt drinks approx. 200 oz. Of water  day, occasional wine  FUNCTIONAL LIMITATIONS: none  PERTINENT HISTORY:  Medications for current condition: none Surgeries: L TKA Other: Arthritis, depression, anxiety, L TKA, HLD, melanoma, bipolar disorder, Varicose veins BLE, sees a chiropractor for neck pain Sexual abuse: No  DIAGNOSTIC FINDINGS:   PAIN:  Are you having pain? No NPRS scale: 0/10  PRECAUTIONS: None  RED FLAGS: None   WEIGHT BEARING RESTRICTIONS: No  FALLS:  Has patient fallen in last 6 months? No  OCCUPATION: retired Engineer, civil (consulting), volunteers at [employer] (healing touch), book club, gardens, playing sports (pickleball)  ACTIVITY LEVEL : pickleball, yoga, hiking, disc golf  PLOF: Independent  PATIENT GOALS: To not have a prolapse and maintain not having the urgency   BOWEL MOVEMENT: Pain with bowel movement: No Type of bowel movement:Frequency 1-2/day Fully empty rectum: Yes:   Leakage: No                                                     Caused by:  Pads: No Fiber supplement/laxative No  URINATION: Pain with urination: No Fully empty bladder: Yes:                                 Post-void dribble: No Stream: Strong Urgency: Yes  Frequency:during the day every 3-4 hours                                                         Nocturia: Yes: 0-1/night   Leakage: Urge to void and Walking to the bathroom Pads/briefs: No but was wearing pantylliners twice a day when dribbling.   INTERCOURSE:  Ability to have vaginal penetration Yes  Pain with intercourse: none Dryness: No Climax: yes  Marinoff Scale:  0/3 Lubricant: sometimes  PREGNANCY: Number of pregnancies: 3 Vaginal deliveries 3 Tearing Yes: tearing with the last one Episiotomy Yes with first two C-section deliveries 0 Currently pregnant No  PROLAPSE: Pressure   OBJECTIVE:  Note: Objective measures were completed at Evaluation unless otherwise noted.   COGNITION: Overall cognitive status: Within functional limits for tasks assessed     SENSATION: Light touch: Appears intact  FUNCTIONAL TESTS:  Single leg stance:  Rt: incr. Postural sway  Lt: incr. Postural sway Squat:  GAIT: Assistive device utilized: None Comments: decr. Trunk rot  POSTURE: decreased lumbar lordosis, increased thoracic kyphosis, posterior pelvic tilt, and left pelvic obliquity   LUMBARAROM/PROM:  A/PROM A/PROM  Eval (% available)  Flexion   Extension   Right lateral flexion   Left lateral flexion   Right rotation   Left rotation    (Blank rows = not tested)  LOWER EXTREMITY ROM:  Active ROM Right eval Left eval  Hip flexion    Hip extension    Hip abduction    Hip adduction    Hip internal rotation    Hip external rotation    Knee flexion    Knee extension    Ankle dorsiflexion    Ankle plantarflexion    Ankle inversion    Ankle eversion     (Blank rows = not tested)  LOWER EXTREMITY MMT:  MMT Right eval Left eval  Hip flexion    Hip extension    Hip abduction    Hip adduction    Hip internal rotation    Hip external rotation    Knee flexion    Knee extension    Ankle dorsiflexion    Ankle plantarflexion    Ankle inversion    Ankle eversion     (Blank rows = not tested) PALPATION:  General: no TTP in standing while palpating spine and SIJ.   PELVIC MMT:   MMT eval  Vaginal   Internal Anal Sphincter   External Anal Sphincter   Puborectalis   Diastasis Recti   (Blank rows = not tested)        TONE: limited by time constraints   PROLAPSE: limited by time constraints   TODAY'S  TREATMENT:                                                                                                                              DATE: 09/29/23  EVAL   SELF CARE: PATIENT EDUCATION:  Education details: PT educated pt on main functions of the pelvic floor, IAP, breath and PFM relationship. PT discussed POC, frequency and duration. PT provided the following education: TOILET POSTURE: Urination: feet flat, lean forward with forearms on legs to fully empty bladder. Bowel movement: place feet flat on Squatty Potty or stool so knees are higher than hips, lean forward to relax pelvic floor in order to avoid strain.  SHOES: wear supportive shoes, and sandals with straps.  POSTURE: try not to cross legs at knees or ankles. Try the figure four stretch instead.  WATER : start with water  first thing in the morning.   PELVIC TILTS: try to stand in neutral, not tucking your tail and not arching back, but in the middle.  Person educated: Patient Education method: Explanation, Demonstration, and Handouts Education comprehension: verbalized understanding, returned demonstration, and needs further education  HOME EXERCISE PROGRAM: Not  yet established.  ASSESSMENT:  CLINICAL IMPRESSION: Patient is a pleasant 68 y.o. female who was seen today for physical therapy evaluation and treatment for cystocele, mixed urinary incontinence.  Pt's PMH is significant for the following: Arthritis, depression, anxiety, L TKA (02/2023), HLD, melanoma, bipolar disorder, Varicose veins BLE. The following impairments were noted upon exam: limited ROM, back/hip pain, postural dysfunction, decr. Strength likely 2/2 subjective reports and gait deviations, nocturia, urgency UI. Pt would benefit from skilled PT to improve safety and decr. Pain during all ADLs.   OBJECTIVE IMPAIRMENTS: Abnormal gait, decreased balance, decreased coordination, decreased ROM, decreased strength, hypomobility, increased fascial restrictions,  postural dysfunction, and pain.   ACTIVITY LIMITATIONS: carrying, lifting, bending, standing, squatting, transfers, continence, and locomotion level  PARTICIPATION LIMITATIONS: meal prep, cleaning, laundry, and community activity  PERSONAL FACTORS: 3+ comorbidities: see above are also affecting patient's functional outcome.   REHAB POTENTIAL: Good  CLINICAL DECISION MAKING: Stable/uncomplicated  EVALUATION COMPLEXITY: Low   GOALS: Goals reviewed with patient? Yes  SHORT TERM GOALS: Target date: for all STGs: 10/27/23  Pt will be IND in HEP to improve pain, strength, coordination. Baseline: works out but scaled back 2/2 cystocele and urgency Goal status: INITIAL  2.  Pt will demo proper toileting posture to fully empty bladder and reduce straining during bowel movement. Baseline: unable to demo Goal status: INITIAL  3.  Pt will demonstrated improved relaxation and contraction of PFM with coordination of breath to reduce urinary leakage to </=once/week. Baseline: with urgency but has been improving. Goal status: INITIAL  4.   Pt will demonstrate proper lifting technique and biomechanics when strength training to decr. Pressure from cystocele. Baseline: unable to demo Goal status: INITIAL   LONG TERM GOALS: Target date: for all LTGs: 11/24/23  Pt will report resuming all activities: hiking, pickleball, yoga and going to the gym for classes to maximize gains made in PT. Baseline: reduced exercise 2/2 cystocele Goal status: INITIAL   PLAN: finish exam (palpation, ROM, MMT, DR) Establish HEP.    PT FREQUENCY: 1x/week  PT DURATION: 8 weeks  PLANNED INTERVENTIONS: 97164- PT Re-evaluation, 97110-Therapeutic exercises, 97530- Therapeutic activity, 97112- Neuromuscular re-education, 97535- Self Care, [id number]- Manual therapy, [phone]- Gait training, [phone] (1-2 muscles), [id number] (3+ muscles)- Dry Needling, Patient/Family education, Balance training, Taping, Joint mobilization, Spinal  mobilization, Scar mobilization, Cryotherapy, Moist heat, and Biofeedback     Dreyden Rohrman L, PT 09/29/2023, 2:12 PM  Delon Pinal, PT,DPT 09/29/23 2:12 PM Phone: [phone] Fax: [phone]

## 2023-09-29 NOTE — Patient Instructions (Addendum)
 TOILET POSTURE: Urination: feet flat, lean forward with forearms on legs to fully empty bladder. Bowel movement: place feet flat on Squatty Potty or stool so knees are higher than hips, lean forward to relax pelvic floor in order to avoid strain.  SHOES: wear supportive shoes, and sandals with straps.  POSTURE: try not to cross legs at knees or ankles. Try the figure four stretch instead.  WATER : start with water  first thing in the morning.   PELVIC TILTS: try to stand in neutral, not tucking your tail and not arching back, but in the middle.  Bladder diary:   ~Urinating every 2-3 hours ~Urine stream for at least 8 seconds. ~Urine color should be light, lemonade  ~If you have the urge, perform deep breathing for 5 reps and then 5-10 reps of pelvic floor contraction.  ~When do you feel the urge?

## 2023-10-03 ENCOUNTER — Telehealth: Payer: Self-pay | Admitting: Internal Medicine

## 2023-10-03 NOTE — Telephone Encounter (Signed)
 Lm and sent MyChart message to call office and reschedule 10/21/2023 appointment/provider out.

## 2023-10-04 DIAGNOSIS — F3132 Bipolar disorder, current episode depressed, moderate: Secondary | ICD-10-CM | POA: Diagnosis not present

## 2023-10-06 ENCOUNTER — Other Ambulatory Visit: Payer: Self-pay

## 2023-10-06 ENCOUNTER — Ambulatory Visit

## 2023-10-06 DIAGNOSIS — N3946 Mixed incontinence: Secondary | ICD-10-CM | POA: Diagnosis not present

## 2023-10-06 DIAGNOSIS — R2689 Other abnormalities of gait and mobility: Secondary | ICD-10-CM

## 2023-10-06 DIAGNOSIS — M6281 Muscle weakness (generalized): Secondary | ICD-10-CM

## 2023-10-06 NOTE — Therapy (Signed)
 OUTPATIENT PHYSICAL THERAPY FEMALE PELVIC TREATMENT    Patient Name: Krystal Weaver MRN: [id number] DOB:March 02, 1955, 68 y.o., female Today's Date: 10/06/2023  END OF SESSION:  PT End of Session - 10/06/23 1405     Visit Number 2    Number of Visits 9    Date for Recertification  11/28/23    Authorization Type [employer]    Progress Note Due on Visit 10    PT Start Time 1402    PT Stop Time 1444    PT Time Calculation (min) 42 min    Activity Tolerance Patient tolerated treatment well    Behavior During Therapy [facility] for tasks assessed/performed          Past Medical History:  Diagnosis Date   Bipolar 2 disorder (HCC)    Cancer (HCC) 11/2016   melanoma- rt lower leg   Degenerative arthritis of left knee 01/2023   Depression    Bi polar type 2   Dysplastic nevus 06/28/2018   R medial antecubital/mod, L prox antecubital/mod   Dysplastic nevus 06/20/2019   Left proximal post. lat. thigh. Moderate to severe atypia, inflamed; close to margin. Excised: 07/31/2019, margins free.   Melanoma (HCC) 11/29/2017   right calf/in situ/WLE   Mixed hyperlipidemia    OSA on CPAP    Osteopenia after menopause    Paroxysmal SVT (supraventricular tachycardia)    Tubular adenoma of colon    Varicella zoster 08/2018   Varicose veins of both lower extremities with pain    Venous insufficiency of both lower extremities    Past Surgical History:  Procedure Laterality Date   BREAST CYST ASPIRATION Right 2004   Negative   BREAST EXCISIONAL BIOPSY Left 1982   Negative - Excisional   COLONOSCOPY  11/22/2005   COLONOSCOPY  03/03/2016   COLONOSCOPY  03/26/2021   DILATION AND CURETTAGE OF UTERUS  1980   KNEE ARTHROPLASTY Left 02/14/2023   Procedure: COMPUTER ASSISTED TOTAL KNEE ARTHROPLASTY;  Surgeon: Mardee Lynwood SQUIBB, MD;  Location: [facility] ORS;  Service: Orthopedics;  Laterality: Left;   MELANOMA EXCISION Right 11/2017   calf (in office)   Patient Active Problem List   Diagnosis Date  Noted   History of total knee arthroplasty, left 02/14/2023   Preoperative evaluation to rule out surgical contraindication 10/13/2022   OSA on CPAP 10/12/2022   Osteopenia after menopause 01/14/2022   Abnormal screening mammogram 01/14/2022   Primary osteoarthritis of left knee 12/08/2020   Melanoma of skin (HCC) 11/21/2019   Paroxysmal SVT (supraventricular tachycardia) 10/14/2018   Tubular adenoma of colon 08/25/2017   Family history of alcoholism 08/18/2016   Varicose veins of both lower extremities with pain 11/03/2015   Chronic venous insufficiency 11/03/2015   Hyperlipidemia 02/20/2015   Bipolar disorder, unspecified (HCC) 08/24/2012   Encounter for preventive health examination 08/24/2012    PCP: Dr. Verneita Kettering  REFERRING PROVIDER: Dr. Heather Penton   REFERRING DIAG: Cystocele and mixed urinary incontinence  THERAPY DIAG:  Urinary incontinence, mixed  Other abnormalities of gait and mobility  Muscle weakness (generalized)  Rationale for Evaluation and Treatment: Rehabilitation  ONSET DATE: 08/24/23 referral date but began approx. Mid-July when she felt the prolapse (maybe grade 1)   SUBJECTIVE:  SUBJECTIVE STATEMENT: Pt reported she drinks approx. 100 oz./day. She stated my pt ed handout was very helpful and she didn't realize how often she crosses her LEs.   EVAL: URINARY FUNCTION: pt voids approx. Every 3-4 hours. Pt feels like she fully empties bladder. She felt her bladder in vaginal canal in July but not since (maybe grade 1 per GYN). Pt sometimes gets up at night to void (0-1/night). Pt reported she experienced urgency getting to the bathroom and had some dribble but not gushing. No longer wearing pads after she began performing PFM exercises (cat/cow, bridging, yoga). She's  cut back on lifting until she sees PFPT. Pt denied pain with voiding. BOWEL FUNCTION: every day, sometimes twice a day. Types 3-4, 75% of the time. Pt denied pain, fully empties rectum and no hemorrhoids.  CORE STABILITY: pt denied MVAs, falls, or illness and surgeries. No chronic back pain. L TKA was first surgery. SEXUAL FUNCTION: pt denied pain with intercourse, OBGYN, or tampon insertion. Climax with clitoral stimulation.   Fluid intake: pt starts with coffee (decaf) and a smoothie in the morning, pt drinks approx. 200 oz. Of water  day, occasional wine  FUNCTIONAL LIMITATIONS: none  PERTINENT HISTORY:  Medications for current condition: none Surgeries: L TKA Other: Arthritis, depression, anxiety, L TKA, HLD, melanoma, bipolar disorder, Varicose veins BLE, sees a chiropractor for neck pain Sexual abuse: No  DIAGNOSTIC FINDINGS:   PAIN:  Are you having pain? No NPRS scale: 0/10  PRECAUTIONS: None  RED FLAGS: None   WEIGHT BEARING RESTRICTIONS: No  FALLS:  Has patient fallen in last 6 months? No  OCCUPATION: retired Engineer, civil (consulting), volunteers at [employer] (healing touch), book club, gardens, playing sports (pickleball)  ACTIVITY LEVEL : pickleball, yoga, hiking, disc golf  PLOF: Independent  PATIENT GOALS: To not have a prolapse and maintain not having the urgency   BOWEL MOVEMENT: Pain with bowel movement: No Type of bowel movement:Frequency 1-2/day Fully empty rectum: Yes:   Leakage: No                                                     Caused by:  Pads: No Fiber supplement/laxative No  URINATION: Pain with urination: No Fully empty bladder: Yes:                                 Post-void dribble: No Stream: Strong Urgency: Yes  Frequency:during the day every 3-4 hours                                                         Nocturia: Yes: 0-1/night   Leakage: Urge to void and Walking to the bathroom Pads/briefs: No but was wearing pantylliners twice a day when  dribbling.   INTERCOURSE:  Ability to have vaginal penetration Yes  Pain with intercourse: none Dryness: No Climax: yes  Marinoff Scale: 0/3 Lubricant: sometimes  PREGNANCY: Number of pregnancies: 3 Vaginal deliveries 3 Tearing Yes: tearing with the last one Episiotomy Yes with first two C-section deliveries 0 Currently pregnant No  PROLAPSE: Pressure   OBJECTIVE:  Note: Objective measures  were completed at Evaluation unless otherwise noted.   COGNITION: Overall cognitive status: Within functional limits for tasks assessed     SENSATION: Light touch: Appears intact   FUNCTIONAL TESTS:  Single leg stance:  Rt: incr. Postural sway  Lt: incr. Postural sway Squat:  GAIT: Assistive device utilized: None Comments: decr. Trunk rot  POSTURE: decreased lumbar lordosis, increased thoracic kyphosis, posterior pelvic tilt, and left pelvic obliquity   LUMBARAROM/PROM:  A/PROM A/PROM  Eval (% available)  Flexion   Extension   Right lateral flexion   Left lateral flexion   Right rotation   Left rotation    (Blank rows = not tested)  LOWER EXTREMITY ROM: WNL  Active ROM Right eval Left eval  Hip flexion 4+ 4+  Hip extension    Hip abduction 3+ 3+  Hip adduction 4- 3+  Hip internal rotation 4 4  Hip external rotation 4- 4  Knee flexion 4+ 4+  Knee extension 5 5  Ankle dorsiflexion 5 5  Ankle plantarflexion    Ankle inversion    Ankle eversion     (Blank rows = not tested)  LOWER EXTREMITY MMT: WFL except for B hip ER slightly limited  MMT Right eval Left eval  Hip flexion    Hip extension    Hip abduction    Hip adduction    Hip internal rotation    Hip external rotation    Knee flexion    Knee extension    Ankle dorsiflexion    Ankle plantarflexion    Ankle inversion    Ankle eversion     (Blank rows = not tested) PALPATION:  General: no TTP in standing while palpating spine and SIJ. 9/25: no DR or TTP. Difficulty performing PFM  contraction without glutes and core.    PELVIC MMT:   MMT eval  Vaginal   Internal Anal Sphincter   External Anal Sphincter   Puborectalis   Diastasis Recti   (Blank rows = not tested)        TONE: WFL   PROLAPSE: Hx of cystocele   TODAY'S TREATMENT:                                                                                                                              DATE: 10/06/23   Physical function test: PT completed exam (palpation, MMT, DR, ROM). See above for details.   NMR:  Access Code: [email or web address] URL: [email or web address] Date: 10/06/2023 Prepared by: Delon Pinal  Exercises - Cat Cow  - 1 x daily - 7 x weekly - 1 sets - 5 reps - Supine Pelvic Floor Contraction with hip add (pillow squeezes) - 1-2 x daily - 7 x weekly - 1 sets - 10 reps Cues and demo for proper technique. S for safety. No pain.   MANUAL THERAPY: Internal PFM assessment: pt agreeable to internal vaginal assessment. No TTP during palpation of B PFM. Extensive cues to decr. Rectus abdominis  and posterior pelvic tilt during PFM contraction as pt pushing out vs. Squeezing up and in. PT then instructed pt to squeeze pillow b/t knees with contraction and was able to progress to 2/5 PFM strength after many reps and cueing. After manual therapy: no pain reported.    SELF CARE: PATIENT EDUCATION:  Education details: PT educated pt on exam findings and pt bulging, contracting glutes and abs during PFM contraction which can incr. Leakage, pressure, and pain. PT initiated HEP and told pt to cease PFM contraction during voiding. Person educated: Patient Education method: Explanation, Demonstration, and Handouts Education comprehension: verbalized understanding, returned demonstration, and needs further education  HOME EXERCISE PROGRAM: [email or web address].  ASSESSMENT:  CLINICAL IMPRESSION: Skilled session focused on completing exam (difficulty coordinating PFM contraction-bulging vs.  Squeeze/lift and compensatory strategies noted), decr. Rib translation with inhale, and hypomobility of tx spine and sacrum-R>L side). Extensive cues and hip add required to correctly perform PFM contraction and relaxation with breath. The following impairments continue to be noted: limited ROM, back/hip pain, postural dysfunction, decr. Strength (hip abd/add), nocturia, urgency UI. Pt would continue to benefit from skilled PT to improve safety and decr. Pain during all ADLs.   OBJECTIVE IMPAIRMENTS: Abnormal gait, decreased balance, decreased coordination, decreased ROM, decreased strength, hypomobility, increased fascial restrictions, postural dysfunction, and pain.   ACTIVITY LIMITATIONS: carrying, lifting, bending, standing, squatting, transfers, continence, and locomotion level  PARTICIPATION LIMITATIONS: meal prep, cleaning, laundry, and community activity  PERSONAL FACTORS: 3+ comorbidities: see above are also affecting patient's functional outcome.   REHAB POTENTIAL: Good  CLINICAL DECISION MAKING: Stable/uncomplicated  EVALUATION COMPLEXITY: Low   GOALS: Goals reviewed with patient? Yes  SHORT TERM GOALS: Target date: for all STGs: 10/27/23  Pt will be IND in HEP to improve pain, strength, coordination. Baseline: works out but scaled back 2/2 cystocele and urgency Goal status: INITIAL  2.  Pt will demo proper toileting posture to fully empty bladder and reduce straining during bowel movement. Baseline: unable to demo Goal status: INITIAL  3.  Pt will demonstrated improved relaxation and contraction of PFM with coordination of breath to reduce urinary leakage to </=once/week. Baseline: with urgency but has been improving. Goal status: INITIAL  4.   Pt will demonstrate proper lifting technique and biomechanics when strength training to decr. Pressure from cystocele. Baseline: unable to demo Goal status: INITIAL   LONG TERM GOALS: Target date: for all LTGs:  11/24/23  Pt will report resuming all activities: hiking, pickleball, yoga and going to the gym for classes to maximize gains made in PT. Baseline: reduced exercise 2/2 cystocele Goal status: INITIAL   PLAN: review HEP and hip strengthening and core.   PT FREQUENCY: 1x/week  PT DURATION: 8 weeks  PLANNED INTERVENTIONS: 97164- PT Re-evaluation, 97110-Therapeutic exercises, 97530- Therapeutic activity, 97112- Neuromuscular re-education, 97535- Self Care, [id number]- Manual therapy, [phone]- Gait training, [phone] (1-2 muscles), [phone] (3+ muscles)- Dry Needling, Patient/Family education, Balance training, Taping, Joint mobilization, Spinal mobilization, Scar mobilization, Cryotherapy, Moist heat, and Biofeedback     Riely Oetken L, PT 10/06/2023, 4:42 PM  Delon Pinal, PT,DPT 10/06/23 4:42 PM Phone: [phone] Fax: [phone]

## 2023-10-10 DIAGNOSIS — F3181 Bipolar II disorder: Secondary | ICD-10-CM | POA: Diagnosis not present

## 2023-10-10 NOTE — Progress Notes (Signed)
 Krystal Weaver                                          MRN: [id number]   10/10/2023   The [facility] Quality Team Specialist reviewed this patient medical record for the purposes of chart review for care gap closure. The following were reviewed: chart review for care gap closure-glycemic status assessment and kidney health evaluation for diabetes:eGFR  and uACR.    [facility] Quality Team

## 2023-10-13 ENCOUNTER — Other Ambulatory Visit: Payer: Self-pay

## 2023-10-13 ENCOUNTER — Ambulatory Visit: Attending: Obstetrics and Gynecology

## 2023-10-13 DIAGNOSIS — N3946 Mixed incontinence: Secondary | ICD-10-CM | POA: Insufficient documentation

## 2023-10-13 DIAGNOSIS — M6281 Muscle weakness (generalized): Secondary | ICD-10-CM | POA: Diagnosis not present

## 2023-10-13 DIAGNOSIS — R2689 Other abnormalities of gait and mobility: Secondary | ICD-10-CM | POA: Diagnosis not present

## 2023-10-13 NOTE — Patient Instructions (Signed)
 Yoga: figure four in seated or standing position vs. Pigeon pose.  Weights: great! Exhale with exertion in seated, tall kneeling or half kneeling to start and progress to standing.  Recliner: fine.  Aquatics: good to go!

## 2023-10-13 NOTE — Therapy (Signed)
 OUTPATIENT PHYSICAL THERAPY FEMALE PELVIC TREATMENT    Patient Name: Krystal Weaver MRN: [id number] DOB:1955-04-20, 68 y.o., female Today's Date: 10/13/2023  END OF SESSION:  PT End of Session - 10/13/23 1407     Visit Number 3    Number of Visits 9    Date for Recertification  11/28/23    Authorization Type [employer]    Progress Note Due on Visit 10    PT Start Time 1404    PT Stop Time 1444    PT Time Calculation (min) 40 min    Activity Tolerance Patient tolerated treatment well    Behavior During Therapy [facility] for tasks assessed/performed          Past Medical History:  Diagnosis Date   Bipolar 2 disorder (HCC)    Cancer (HCC) 11/2016   melanoma- rt lower leg   Degenerative arthritis of left knee 01/2023   Depression    Bi polar type 2   Dysplastic nevus 06/28/2018   R medial antecubital/mod, L prox antecubital/mod   Dysplastic nevus 06/20/2019   Left proximal post. lat. thigh. Moderate to severe atypia, inflamed; close to margin. Excised: 07/31/2019, margins free.   Melanoma (HCC) 11/29/2017   right calf/in situ/WLE   Mixed hyperlipidemia    OSA on CPAP    Osteopenia after menopause    Paroxysmal SVT (supraventricular tachycardia)    Tubular adenoma of colon    Varicella zoster 08/2018   Varicose veins of both lower extremities with pain    Venous insufficiency of both lower extremities    Past Surgical History:  Procedure Laterality Date   BREAST CYST ASPIRATION Right 2004   Negative   BREAST EXCISIONAL BIOPSY Left 1982   Negative - Excisional   COLONOSCOPY  11/22/2005   COLONOSCOPY  03/03/2016   COLONOSCOPY  03/26/2021   DILATION AND CURETTAGE OF UTERUS  1980   KNEE ARTHROPLASTY Left 02/14/2023   Procedure: COMPUTER ASSISTED TOTAL KNEE ARTHROPLASTY;  Surgeon: Mardee Lynwood SQUIBB, MD;  Location: [facility] ORS;  Service: Orthopedics;  Laterality: Left;   MELANOMA EXCISION Right 11/2017   calf (in office)   Patient Active Problem List   Diagnosis Date  Noted   History of total knee arthroplasty, left 02/14/2023   Preoperative evaluation to rule out surgical contraindication 10/13/2022   OSA on CPAP 10/12/2022   Osteopenia after menopause 01/14/2022   Abnormal screening mammogram 01/14/2022   Primary osteoarthritis of left knee 12/08/2020   Melanoma of skin (HCC) 11/21/2019   Paroxysmal SVT (supraventricular tachycardia) 10/14/2018   Tubular adenoma of colon 08/25/2017   Family history of alcoholism 08/18/2016   Varicose veins of both lower extremities with pain 11/03/2015   Chronic venous insufficiency 11/03/2015   Hyperlipidemia 02/20/2015   Bipolar disorder, unspecified (HCC) 08/24/2012   Encounter for preventive health examination 08/24/2012    PCP: Dr. Verneita Kettering  REFERRING PROVIDER: Dr. Heather Penton   REFERRING DIAG: Cystocele and mixed urinary incontinence  THERAPY DIAG:  Urinary incontinence, mixed  Other abnormalities of gait and mobility  Muscle weakness (generalized)  Rationale for Evaluation and Treatment: Rehabilitation  ONSET DATE: 08/24/23 referral date but began approx. Mid-July when she felt the prolapse (maybe grade 1)   SUBJECTIVE:  SUBJECTIVE STATEMENT: Pt reported she is a little stressed 2/2 packing for [place] tomorrow and family is going through [place]. She realized she needs to stop checking for prolapse in the mirror. She found a PF yoga class. Pt is performing Y360 at [facility]    EVAL: URINARY FUNCTION: pt voids approx. Every 3-4 hours. Pt feels like she fully empties bladder. She felt her bladder in vaginal canal in July but not since (maybe grade 1 per GYN). Pt sometimes gets up at night to void (0-1/night). Pt reported she experienced urgency getting to the bathroom and had some dribble but not gushing. No longer  wearing pads after she began performing PFM exercises (cat/cow, bridging, yoga). She's cut back on lifting until she sees PFPT. Pt denied pain with voiding. BOWEL FUNCTION: every day, sometimes twice a day. Types 3-4, 75% of the time. Pt denied pain, fully empties rectum and no hemorrhoids.  CORE STABILITY: pt denied MVAs, falls, or illness and surgeries. No chronic back pain. L TKA was first surgery. SEXUAL FUNCTION: pt denied pain with intercourse, OBGYN, or tampon insertion. Climax with clitoral stimulation.   Fluid intake: pt starts with coffee (decaf) and a smoothie in the morning, pt drinks approx. 200 oz. Of water  day, occasional wine  FUNCTIONAL LIMITATIONS: none  PERTINENT HISTORY:  Medications for current condition: none Surgeries: L TKA Other: Arthritis, depression, anxiety, L TKA, HLD, melanoma, bipolar disorder, Varicose veins BLE, sees a chiropractor for neck pain Sexual abuse: No  DIAGNOSTIC FINDINGS:   PAIN:  Are you having pain? No 10/13/23 NPRS scale: 0/10  PRECAUTIONS: None  RED FLAGS: None   WEIGHT BEARING RESTRICTIONS: No  FALLS:  Has patient fallen in last 6 months? No  OCCUPATION: retired Engineer, civil (consulting), volunteers at [employer] (healing touch), book club, gardens, playing sports (pickleball)  ACTIVITY LEVEL : pickleball, yoga, hiking, disc golf  PLOF: Independent  PATIENT GOALS: To not have a prolapse and maintain not having the urgency   BOWEL MOVEMENT: Pain with bowel movement: No Type of bowel movement:Frequency 1-2/day Fully empty rectum: Yes:   Leakage: No                                                     Caused by:  Pads: No Fiber supplement/laxative No  URINATION: Pain with urination: No Fully empty bladder: Yes:                                 Post-void dribble: No Stream: Strong Urgency: Yes  Frequency:during the day every 3-4 hours                                                         Nocturia: Yes: 0-1/night   Leakage: Urge to void  and Walking to the bathroom Pads/briefs: No but was wearing pantylliners twice a day when dribbling.   INTERCOURSE:  Ability to have vaginal penetration Yes  Pain with intercourse: none Dryness: No Climax: yes  Marinoff Scale: 0/3 Lubricant: sometimes  PREGNANCY: Number of pregnancies: 3 Vaginal deliveries 3 Tearing Yes: tearing with the last one Episiotomy Yes with  first two C-section deliveries 0 Currently pregnant No  PROLAPSE: Pressure   OBJECTIVE:  Note: Objective measures were completed at Evaluation unless otherwise noted.   COGNITION: Overall cognitive status: Within functional limits for tasks assessed     SENSATION: Light touch: Appears intact   FUNCTIONAL TESTS:  Single leg stance:  Rt: incr. Postural sway  Lt: incr. Postural sway Squat:  GAIT: Assistive device utilized: None Comments: decr. Trunk rot  POSTURE: decreased lumbar lordosis, increased thoracic kyphosis, posterior pelvic tilt, and left pelvic obliquity   LUMBARAROM/PROM:  A/PROM A/PROM  Eval (% available)  Flexion   Extension   Right lateral flexion   Left lateral flexion   Right rotation   Left rotation    (Blank rows = not tested)  LOWER EXTREMITY ROM: WNL  Active ROM Right eval Left eval  Hip flexion 4+ 4+  Hip extension    Hip abduction 3+ 3+  Hip adduction 4- 3+  Hip internal rotation 4 4  Hip external rotation 4- 4  Knee flexion 4+ 4+  Knee extension 5 5  Ankle dorsiflexion 5 5  Ankle plantarflexion    Ankle inversion    Ankle eversion     (Blank rows = not tested)  LOWER EXTREMITY MMT: WFL except for B hip ER slightly limited  MMT Right eval Left eval  Hip flexion    Hip extension    Hip abduction    Hip adduction    Hip internal rotation    Hip external rotation    Knee flexion    Knee extension    Ankle dorsiflexion    Ankle plantarflexion    Ankle inversion    Ankle eversion     (Blank rows = not tested) PALPATION:  General: no TTP in  standing while palpating spine and SIJ. 9/25: no DR or TTP. Difficulty performing PFM contraction without glutes and core.    PELVIC MMT:   MMT eval  Vaginal   Internal Anal Sphincter   External Anal Sphincter   Puborectalis   Diastasis Recti   (Blank rows = not tested)        TONE: WFL   PROLAPSE: Hx of cystocele   TODAY'S TREATMENT:                                                                                                                              DATE: 10/13/23    NMR:  Exercises Access Code: [email or web address] URL: [email or web address] Date: 10/13/2023 Prepared by: Delon Pinal  Exercises - Cat Cow  - 1 x daily - 7 x weekly - 1 sets - 5 reps and seated cat/cow (ceased-too much pressure) - Supine and prone Pelvic Floor Contraction  - 1-2 x daily - 7 x weekly - 1 sets - 10 reps - Dead Bug  - 1 x daily - 3 x weekly - 3 sets - 10 reps with and without LEs in 90/90. - Lateral Lunge  -  1 x daily - 3 x weekly - 2-3 sets - 10 reps  All with breath work. -Seated and standing piriformis stretch x 3 reps with 30 sec. Holds. Cues and demo for proper technique. S for safety. No pain.      SELF CARE: PATIENT EDUCATION:  Education details: Pt questions: yoga, aquatics, and weights. Educated on breath work and exhale with exertion. Progressed HEP and discussed how to perform HEP prior to pickleball while on vacation. Person educated: Patient Education method: Explanation, Demonstration, and Handouts Education comprehension: verbalized understanding, returned demonstration, and needs further education  HOME EXERCISE PROGRAM: [email or web address].  ASSESSMENT:  CLINICAL IMPRESSION: Skilled session focused progressing to strengthening of core, hips, and LLE to reduce force on PFM. Pt demonstrated progress as she was able to contraction and relax PFM without bearing down. No pain during session. The following impairments continue to be noted: limited ROM, back/hip pain,  postural dysfunction, decr. Strength (hip abd/add), nocturia, urgency UI. Pt would continue to benefit from skilled PT to improve safety and decr. Pain during all ADLs.   OBJECTIVE IMPAIRMENTS: Abnormal gait, decreased balance, decreased coordination, decreased ROM, decreased strength, hypomobility, increased fascial restrictions, postural dysfunction, and pain.   ACTIVITY LIMITATIONS: carrying, lifting, bending, standing, squatting, transfers, continence, and locomotion level  PARTICIPATION LIMITATIONS: meal prep, cleaning, laundry, and community activity  PERSONAL FACTORS: 3+ comorbidities: see above are also affecting patient's functional outcome.   REHAB POTENTIAL: Good  CLINICAL DECISION MAKING: Stable/uncomplicated  EVALUATION COMPLEXITY: Low   GOALS: Goals reviewed with patient? Yes  SHORT TERM GOALS: Target date: for all STGs: 10/27/23  Pt will be IND in HEP to improve pain, strength, coordination. Baseline: works out but scaled back 2/2 cystocele and urgency Goal status: INITIAL  2.  Pt will demo proper toileting posture to fully empty bladder and reduce straining during bowel movement. Baseline: unable to demo Goal status: INITIAL  3.  Pt will demonstrated improved relaxation and contraction of PFM with coordination of breath to reduce urinary leakage to </=once/week. Baseline: with urgency but has been improving. Goal status: INITIAL  4.   Pt will demonstrate proper lifting technique and biomechanics when strength training to decr. Pressure from cystocele. Baseline: unable to demo Goal status: INITIAL   LONG TERM GOALS: Target date: for all LTGs: 11/24/23  Pt will report resuming all activities: hiking, pickleball, yoga and going to the gym for classes to maximize gains made in PT. Baseline: reduced exercise 2/2 cystocele Goal status: INITIAL   PLAN: check STGs, progress HEP and hip strengthening and core.   PT FREQUENCY: 1x/week  PT DURATION: 8  weeks  PLANNED INTERVENTIONS: 97164- PT Re-evaluation, 97110-Therapeutic exercises, 97530- Therapeutic activity, 97112- Neuromuscular re-education, 97535- Self Care, 02859- Manual therapy, [id number]- Gait training, [phone] (1-2 muscles), 20561 (3+ muscles)- Dry Needling, Patient/Family education, Balance training, Taping, Joint mobilization, Spinal mobilization, Scar mobilization, Cryotherapy, Moist heat, and Biofeedback     Macario Shear L, PT 10/13/2023, 2:08 PM  Delon Pinal, PT,DPT 10/13/23 2:08 PM Phone: [phone] Fax: [phone]

## 2023-10-17 ENCOUNTER — Ambulatory Visit: Admitting: Dermatology

## 2023-10-18 ENCOUNTER — Encounter: Payer: Medicare HMO | Admitting: Internal Medicine

## 2023-10-20 ENCOUNTER — Ambulatory Visit

## 2023-10-21 ENCOUNTER — Encounter: Admitting: Internal Medicine

## 2023-10-24 ENCOUNTER — Ambulatory Visit: Admitting: Dermatology

## 2023-10-24 DIAGNOSIS — L578 Other skin changes due to chronic exposure to nonionizing radiation: Secondary | ICD-10-CM

## 2023-10-24 DIAGNOSIS — I8390 Asymptomatic varicose veins of unspecified lower extremity: Secondary | ICD-10-CM

## 2023-10-24 DIAGNOSIS — D2271 Melanocytic nevi of right lower limb, including hip: Secondary | ICD-10-CM

## 2023-10-24 DIAGNOSIS — D489 Neoplasm of uncertain behavior, unspecified: Secondary | ICD-10-CM | POA: Diagnosis not present

## 2023-10-24 DIAGNOSIS — Z86018 Personal history of other benign neoplasm: Secondary | ICD-10-CM

## 2023-10-24 DIAGNOSIS — W908XXA Exposure to other nonionizing radiation, initial encounter: Secondary | ICD-10-CM | POA: Diagnosis not present

## 2023-10-24 DIAGNOSIS — Z85820 Personal history of malignant melanoma of skin: Secondary | ICD-10-CM | POA: Diagnosis not present

## 2023-10-24 DIAGNOSIS — D229 Melanocytic nevi, unspecified: Secondary | ICD-10-CM

## 2023-10-24 DIAGNOSIS — F3181 Bipolar II disorder: Secondary | ICD-10-CM | POA: Diagnosis not present

## 2023-10-24 DIAGNOSIS — L814 Other melanin hyperpigmentation: Secondary | ICD-10-CM | POA: Diagnosis not present

## 2023-10-24 DIAGNOSIS — Z1283 Encounter for screening for malignant neoplasm of skin: Secondary | ICD-10-CM

## 2023-10-24 DIAGNOSIS — L821 Other seborrheic keratosis: Secondary | ICD-10-CM

## 2023-10-24 DIAGNOSIS — D2339 Other benign neoplasm of skin of other parts of face: Secondary | ICD-10-CM

## 2023-10-24 NOTE — Patient Instructions (Addendum)
 Biopsy Wound Care Instructions  Leave the original bandage on for 24 hours if possible.  If the bandage becomes soaked or soiled before that time, it is OK to remove it and examine the wound.  A small amount of post-operative bleeding is normal.  If excessive bleeding occurs, remove the bandage, place gauze over the site and apply continuous pressure (no peeking) over the area for 30 minutes. If this does not work, please call our clinic as soon as possible or page your doctor if it is after hours.   Once a day, cleanse the wound with soap and water. It is fine to shower. If a thick crust develops you may use a Q-tip dipped into dilute hydrogen peroxide (mix 1:1 with water) to dissolve it.  Hydrogen peroxide can slow the healing process, so use it only as needed.    After washing, apply petroleum jelly (Vaseline) or an antibiotic ointment if your doctor prescribed one for you, followed by a bandage.    For best healing, the wound should be covered with a layer of ointment at all times. If you are not able to keep the area covered with a bandage to hold the ointment in place, this may mean re-applying the ointment several times a day.  Continue this wound care until the wound has healed and is no longer open.   Itching and mild discomfort is normal during the healing process. However, if you develop pain or severe itching, please call our office.   If you have any discomfort, you can take Tylenol (acetaminophen) or ibuprofen as directed on the bottle. (Please do not take these if you have an allergy to them or cannot take them for another reason).  Some redness, tenderness and white or yellow material in the wound is normal healing.  If the area becomes very sore and red, or develops a thick yellow-green material (pus), it may be infected; please notify us .    If you have stitches, return to clinic as directed to have the stitches removed. You will continue wound care for 2-3 days after the stitches  are removed.   Wound healing continues for up to one year following surgery. It is not unusual to experience pain in the scar from time to time during the interval.  If the pain becomes severe or the scar thickens, you should notify the office.    A slight amount of redness in a scar is expected for the first six months.  After six months, the redness will fade and the scar will soften and fade.  The color difference becomes less noticeable with time.  If there are any problems, return for a post-op surgery check at your earliest convenience.  To improve the appearance of the scar, you can use silicone scar gel, cream, or sheets (such as Mederma or Serica) every night for up to one year. These are available over the counter (without a prescription).  Please call our office at [phone] for any questions or concerns.       Melanoma ABCDEs  Melanoma is the most dangerous type of skin cancer, and is the leading cause of death from skin disease.  You are more likely to develop melanoma if you: Have light-colored skin, light-colored eyes, or red or blond hair Spend a lot of time in the sun Tan regularly, either outdoors or in a tanning bed Have had blistering sunburns, especially during childhood Have a close family member who has had a melanoma Have atypical moles  or large birthmarks  Early detection of melanoma is key since treatment is typically straightforward and cure rates are extremely high if we catch it early.   The first sign of melanoma is often a change in a mole or a new dark spot.  The ABCDE system is a way of remembering the signs of melanoma.  A for asymmetry:  The two halves do not match. B for border:  The edges of the growth are irregular. C for color:  A mixture of colors are present instead of an even brown color. D for diameter:  Melanomas are usually (but not always) greater than 6mm - the size of a pencil eraser. E for evolution:  The spot keeps changing in  size, shape, and color.  Please check your skin once per month between visits. You can use a small mirror in front and a large mirror behind you to keep an eye on the back side or your body.   If you see any new or changing lesions before your next follow-up, please call to schedule a visit.  Please continue daily skin protection including broad spectrum sunscreen SPF 30+ to sun-exposed areas, reapplying every 2 hours as needed when you're outdoors.   Staying in the shade or wearing long sleeves, sun glasses (UVA+UVB protection) and wide brim hats (4-inch brim around the entire circumference of the hat) are also recommended for sun protection.     Due to recent changes in healthcare laws, you may see results of your pathology and/or laboratory studies on MyChart before the doctors have had a chance to review them. We understand that in some cases there may be results that are confusing or concerning to you. Please understand that not all results are received at the same time and often the doctors may need to interpret multiple results in order to provide you with the best plan of care or course of treatment. Therefore, we ask that you please give us  2 business days to thoroughly review all your results before contacting the office for clarification. Should we see a critical lab result, you will be contacted sooner.   If You Need Anything After Your Visit  If you have any questions or concerns for your doctor, please call our main line at [phone] and press option 4 to reach your doctor's medical assistant. If no one answers, please leave a voicemail as directed and we will return your call as soon as possible. Messages left after 4 pm will be answered the following business day.   You may also send us  a message via MyChart. We typically respond to MyChart messages within 1-2 business days.  For prescription refills, please ask your pharmacy to contact our office. Our fax number is  [phone].  If you have an urgent issue when the clinic is closed that cannot wait until the next business day, you can page your doctor at the number below.    Please note that while we do our best to be available for urgent issues outside of office hours, we are not available 24/7.   If you have an urgent issue and are unable to reach us , you may choose to seek medical care at your doctor's office, retail clinic, urgent care center, or emergency room.  If you have a medical emergency, please immediately call 911 or go to the emergency department.  Pager Numbers  - Dr. Hester: [phone]  - Dr. Jackquline: [phone]  - Dr. Claudene: [phone]   - Dr. Raymund:  [phone]  In the event of inclement weather, please call our main line at [phone] for an update on the status of any delays or closures.  Dermatology Medication Tips: Please keep the boxes that topical medications come in in order to help keep track of the instructions about where and how to use these. Pharmacies typically print the medication instructions only on the boxes and not directly on the medication tubes.   If your medication is too expensive, please contact our office at [phone] option 4 or send us  a message through MyChart.   We are unable to tell what your co-pay for medications will be in advance as this is different depending on your insurance coverage. However, we may be able to find a substitute medication at lower cost or fill out paperwork to get insurance to cover a needed medication.   If a prior authorization is required to get your medication covered by your insurance company, please allow us  1-2 business days to complete this process.  Drug prices often vary depending on where the prescription is filled and some pharmacies may offer cheaper prices.  The website www.goodrx.com contains coupons for medications through different pharmacies. The prices here do not account for what the cost  may be with help from insurance (it may be cheaper with your insurance), but the website can give you the price if you did not use any insurance.  - You can print the associated coupon and take it with your prescription to the pharmacy.  - You may also stop by our office during regular business hours and pick up a GoodRx coupon card.  - If you need your prescription sent electronically to a different pharmacy, notify our office through [facility] or by phone at [phone] option 4.     Si Usted Necesita Algo Despus de Su Visita  Tambin puede enviarnos un mensaje a travs de Clinical cytogeneticist. Por lo general respondemos a los mensajes de MyChart en el transcurso de 1 a 2 das hbiles.  Para renovar recetas, por favor pida a su farmacia que se ponga en contacto con nuestra oficina. Randi lakes de fax es [place] [phone].  Si tiene un asunto urgente cuando la clnica est cerrada y que no puede esperar hasta el siguiente da hbil, puede llamar/localizar a su doctor(a) al nmero que aparece a continuacin.   Por favor, tenga en cuenta que aunque hacemos todo lo posible para estar disponibles para asuntos urgentes fuera del horario de [place], no estamos disponibles las 24 horas del da, los 7 [address]  [address] de la [place].   Si tiene un problema urgente y no puede comunicarse con nosotros, puede optar por buscar atencin mdica  en el consultorio de su doctor(a), en una clnica privada, en un centro de atencin urgente o en una sala de emergencias.  Si tiene Engineer, drilling, por favor llame inmediatamente al 911 o vaya a la sala de emergencias.  Nmeros de bper  - Dr. Hester: [phone]  - Dra. Jackquline: [phone]  - Dr. Claudene: [phone]  - Dra. Kitts: [phone]  En caso de inclemencias del [place], por favor llame a nuestra lnea principal al [phone] para una actualizacin sobre el estado de cualquier retraso o cierre.  Consejos para la medicacin en dermatologa: Por  favor, guarde las cajas en las que vienen los medicamentos de uso tpico para ayudarle a seguir las instrucciones sobre dnde y cmo usarlos. Las farmacias generalmente imprimen las instrucciones del medicamento slo en las cajas y no directamente  en los tubos del medicamento.   Si su medicamento es muy caro, por favor, pngase en contacto con landry rieger llamando al [phone] y presione la opcin 4 o envenos un mensaje a travs de Clinical cytogeneticist.   No podemos decirle cul ser su copago por los medicamentos por adelantado ya que esto es diferente dependiendo de la cobertura de su seguro. Sin embargo, es posible que podamos encontrar un medicamento sustituto a Audiological scientist un formulario para que el seguro cubra el medicamento que se considera necesario.   Si se requiere una autorizacin previa para que su compaa de seguros malta su medicamento, por favor permtanos de 1 a 2 das hbiles para completar este proceso.  Los precios de los medicamentos varan con frecuencia dependiendo del Environmental consultant de dnde se surte la receta y alguna farmacias pueden ofrecer precios ms baratos.  El sitio web www.goodrx.com tiene cupones para medicamentos de Health and safety inspector. Los precios aqu no tienen en cuenta lo que podra costar con la ayuda del seguro (puede ser ms barato con su seguro), pero el sitio web puede darle el precio si no utiliz Tourist information centre manager.  - Puede imprimir el cupn correspondiente y llevarlo con su receta a la farmacia.  - Tambin puede pasar por nuestra oficina durante el horario de atencin regular y Education officer, museum una tarjeta de cupones de GoodRx.  - Si necesita que su receta se enve electrnicamente a una farmacia diferente, informe a nuestra oficina a travs de MyChart de [place] o por telfono llamando al [phone] y presione la opcin 4.

## 2023-10-24 NOTE — Progress Notes (Unsigned)
 Follow-Up Visit   Subjective  Krystal Weaver is a 68 y.o. female who presents for the following: Skin Cancer Screening and Full Body Skin Exam Hx of mmis, hx of dysplastic nevi, hx of aks Hx of blue nevus   The patient presents for Total-Body Skin Exam (TBSE) for skin cancer screening and mole check. The patient has spots, moles and lesions to be evaluated, some may be new or changing and the patient may have concern these could be cancer.  The following portions of the chart were reviewed this encounter and updated as appropriate: medications, allergies, medical history  Review of Systems:  No other skin or systemic complaints except as noted in HPI or Assessment and Plan.  Objective  Well appearing patient in no apparent distress; mood and affect are within normal limits.  A full examination was performed including scalp, head, eyes, ears, nose, lips, neck, chest, axillae, abdomen, back, buttocks, bilateral upper extremities, bilateral lower extremities, hands, feet, fingers, toes, fingernails, and toenails. All findings within normal limits unless otherwise noted below.   Relevant physical exam findings are noted in the Assessment and Plan.  right mid anterior thigh 0.5 cm brown macule    Assessment & Plan   HISTORY OF MELANOMA IN SITU 11/29/2017 right calf - WLE  - No evidence of recurrence today - No lymphadenopathy palpated today. - Recommend regular full body skin exams - Recommend daily broad spectrum sunscreen SPF 30+ to sun-exposed areas, reapply every 2 hours as needed.  - Call if any new or changing lesions are noted between office visits  HISTORY OF DYSPLASTIC NEVUS 06/20/2019 Left proximal posterior lateral thigh - mod to severe 07/31/19 margins free 06/28/2018 right medial antecubital - moderate, left proximal antecubital - moderate  No evidence of recurrence today Recommend regular full body skin exams Recommend daily broad spectrum sunscreen SPF 30+ to  sun-exposed areas, reapply every 2 hours as needed.  Call if any new or changing lesions are noted between office visits   SKIN CANCER SCREENING PERFORMED TODAY.  ACTINIC DAMAGE - Chronic condition, secondary to cumulative UV/sun exposure - diffuse scaly erythematous macules with underlying dyspigmentation - Recommend daily broad spectrum sunscreen SPF 30+ to sun-exposed areas, reapply every 2 hours as needed.  - Staying in the shade or wearing long sleeves, sun glasses (UVA+UVB protection) and wide brim hats (4-inch brim around the entire circumference of the hat) are also recommended for sun protection.  - Call for new or changing lesions.  LENTIGINES, SEBORRHEIC KERATOSES, HEMANGIOMAS - Benign normal skin lesions - Benign-appearing - Call for any changes - sk   MELANOCYTIC NEVI - Tan-brown and/or pink-flesh-colored symmetric macules and papules - Benign appearing on exam today - Observation - Call clinic for new or changing moles - Recommend daily use of broad spectrum spf 30+ sunscreen to sun-exposed areas.   Blue nevus Right lat nose/medial cheek Exam 0.2 cm blue macule  No change for many years Benign-appearing.  Observation.  Call clinic for new or changing lesions.  Recommend daily use of broad spectrum spf 30+ sunscreen to sun-exposed areas.   Varicose Veins/Spider Veins - Dilated blue, purple or red veins at the lower extremities - Reassured - Smaller vessels can be treated by sclerotherapy (a procedure to inject a medicine into the veins to make them disappear) if desired, but the treatment is not covered by insurance. Larger vessels may be covered if symptomatic and we would refer to vascular surgeon if treatment desired.  NEOPLASM OF UNCERTAIN  BEHAVIOR right mid anterior thigh Epidermal / dermal shaving  Lesion diameter (cm):  0.5 Informed consent: discussed and consent obtained   Timeout: patient name, date of birth, surgical site, and procedure verified    Procedure prep:  Patient was prepped and draped in usual sterile fashion Prep type:  Isopropyl alcohol Anesthesia: the lesion was anesthetized in a standard fashion   Anesthetic:  1% lidocaine  w/ epinephrine 1-100,000 buffered w/ 8.4% NaHCO3 Instrument used: flexible razor blade   Hemostasis achieved with: pressure, aluminum chloride and electrodesiccation   Outcome: patient tolerated procedure well   Post-procedure details: sterile dressing applied and wound care instructions given   Dressing type: bandage and petrolatum    Specimen 1 - Surgical pathology Differential Diagnosis: nevus r/o dysplasia   Check Margins: yes SKIN CANCER SCREENING   HISTORY OF MALIGNANT MELANOMA   ACTINIC SKIN DAMAGE   HISTORY OF DYSPLASTIC NEVUS   LENTIGO   MELANOCYTIC NEVUS, UNSPECIFIED LOCATION   Return in about 1 year (around 10/23/2024) for TBSE.  IEleanor Blush, CMA, am acting as scribe for Alm Rhyme, MD.   Documentation: I have reviewed the above documentation for accuracy and completeness, and I agree with the above.  Alm Rhyme, MD

## 2023-10-25 ENCOUNTER — Ambulatory Visit: Payer: Self-pay | Admitting: Dermatology

## 2023-10-25 ENCOUNTER — Encounter: Payer: Self-pay | Admitting: Dermatology

## 2023-10-25 DIAGNOSIS — M542 Cervicalgia: Secondary | ICD-10-CM | POA: Diagnosis not present

## 2023-10-25 DIAGNOSIS — M7918 Myalgia, other site: Secondary | ICD-10-CM | POA: Diagnosis not present

## 2023-10-25 DIAGNOSIS — M5451 Vertebrogenic low back pain: Secondary | ICD-10-CM | POA: Diagnosis not present

## 2023-10-25 DIAGNOSIS — M9903 Segmental and somatic dysfunction of lumbar region: Secondary | ICD-10-CM | POA: Diagnosis not present

## 2023-10-25 DIAGNOSIS — M9904 Segmental and somatic dysfunction of sacral region: Secondary | ICD-10-CM | POA: Diagnosis not present

## 2023-10-25 DIAGNOSIS — M9901 Segmental and somatic dysfunction of cervical region: Secondary | ICD-10-CM | POA: Diagnosis not present

## 2023-10-25 LAB — SURGICAL PATHOLOGY

## 2023-10-26 ENCOUNTER — Encounter: Payer: Self-pay | Admitting: Dermatology

## 2023-10-26 DIAGNOSIS — F3132 Bipolar disorder, current episode depressed, moderate: Secondary | ICD-10-CM | POA: Diagnosis not present

## 2023-10-26 DIAGNOSIS — F411 Generalized anxiety disorder: Secondary | ICD-10-CM | POA: Diagnosis not present

## 2023-10-26 NOTE — Telephone Encounter (Signed)
-----   Message from Alm Rhyme sent at 10/25/2023  6:57 PM EDT ----- FINAL DIAGNOSIS        1. Skin, right mid anterior thigh :       DYSPLASTIC JUNCTIONAL NEVUS WITH SEVERE ATYPIA, INFLAMED, CLOSE TO MARGIN, SEE       DESCRIPTION   Severe dysplastic Margins free, but CLOSE TO MARGIN Due to history of Melanoma, recommend Excision to ensure eradicated. Schedule surgery ----- Message ----- From: Interface, Lab In Three Zero One Sent: 10/25/2023   5:49 PM EDT To: Alm JAYSON Rhyme, MD

## 2023-10-26 NOTE — Telephone Encounter (Signed)
 Patient called back, patient had seen results on MyChart and voiced understanding to all. Patient scheduled for surgery with Dr. Raymund on 11/4.

## 2023-10-26 NOTE — Telephone Encounter (Addendum)
 Tried calling patient regarding bx results and to schedule treatment. No answer. LM for patient to return call.   ----- Message from Alm Rhyme sent at 10/25/2023  6:57 PM EDT ----- FINAL DIAGNOSIS        1. Skin, right mid anterior thigh :       DYSPLASTIC JUNCTIONAL NEVUS WITH SEVERE ATYPIA, INFLAMED, CLOSE TO MARGIN, SEE       DESCRIPTION   Severe dysplastic Margins free, but CLOSE TO MARGIN Due to history of Melanoma, recommend Excision to ensure eradicated. Schedule surgery ----- Message ----- From: Interface, Lab In Three Zero One Sent: 10/25/2023   5:49 PM EDT To: Alm JAYSON Rhyme, MD

## 2023-10-27 ENCOUNTER — Other Ambulatory Visit: Payer: Self-pay

## 2023-10-27 ENCOUNTER — Ambulatory Visit

## 2023-10-27 DIAGNOSIS — N3946 Mixed incontinence: Secondary | ICD-10-CM

## 2023-10-27 DIAGNOSIS — R2689 Other abnormalities of gait and mobility: Secondary | ICD-10-CM

## 2023-10-27 DIAGNOSIS — M6281 Muscle weakness (generalized): Secondary | ICD-10-CM | POA: Diagnosis not present
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
## 2023-10-28 DIAGNOSIS — M9901 Segmental and somatic dysfunction of cervical region: Secondary | ICD-10-CM | POA: Diagnosis not present

## 2023-10-28 DIAGNOSIS — M9903 Segmental and somatic dysfunction of lumbar region: Secondary | ICD-10-CM | POA: Diagnosis not present

## 2023-10-28 DIAGNOSIS — M7918 Myalgia, other site: Secondary | ICD-10-CM | POA: Diagnosis not present

## 2023-10-28 DIAGNOSIS — M542 Cervicalgia: Secondary | ICD-10-CM | POA: Diagnosis not present

## 2023-10-28 DIAGNOSIS — M9904 Segmental and somatic dysfunction of sacral region: Secondary | ICD-10-CM | POA: Diagnosis not present

## 2023-10-28 DIAGNOSIS — M5451 Vertebrogenic low back pain: Secondary | ICD-10-CM | POA: Diagnosis not present

## 2023-11-03 ENCOUNTER — Ambulatory Visit

## 2023-11-03 ENCOUNTER — Other Ambulatory Visit: Payer: Self-pay

## 2023-11-03 DIAGNOSIS — R2689 Other abnormalities of gait and mobility: Secondary | ICD-10-CM

## 2023-11-03 DIAGNOSIS — N3946 Mixed incontinence: Secondary | ICD-10-CM | POA: Diagnosis not present

## 2023-11-03 DIAGNOSIS — M6281 Muscle weakness (generalized): Secondary | ICD-10-CM | POA: Diagnosis not present
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
## 2023-11-10 ENCOUNTER — Ambulatory Visit

## 2023-11-10 NOTE — Progress Notes (Signed)
 Krystal Weaver                                          MRN: [id number]   11/10/2023   The [facility] Quality Team Specialist reviewed this patient medical record for the purposes of chart review for care gap closure. The following were reviewed: chart review for care gap closure-diabetic eye exam.    [facility] Quality Team

## 2023-11-15 ENCOUNTER — Telehealth: Payer: Self-pay

## 2023-11-15 ENCOUNTER — Ambulatory Visit (INDEPENDENT_AMBULATORY_CARE_PROVIDER_SITE_OTHER)

## 2023-11-15 DIAGNOSIS — D2271 Melanocytic nevi of right lower limb, including hip: Secondary | ICD-10-CM

## 2023-11-15 DIAGNOSIS — Z85820 Personal history of malignant melanoma of skin: Secondary | ICD-10-CM

## 2023-11-15 DIAGNOSIS — D239 Other benign neoplasm of skin, unspecified: Secondary | ICD-10-CM

## 2023-11-15 DIAGNOSIS — L988 Other specified disorders of the skin and subcutaneous tissue: Secondary | ICD-10-CM | POA: Diagnosis not present

## 2023-11-15 NOTE — Telephone Encounter (Signed)
 Patient states that she is doing well after today's surgery.

## 2023-11-15 NOTE — Telephone Encounter (Signed)
 Erroneous encounter

## 2023-11-15 NOTE — Progress Notes (Signed)
 Subjective   Krystal Weaver is a 68 y.o. female who presents for the following: Surgical excision of bx proven severely dysplastic nevus of the R ant thigh. Pacemaker/Defibrillator: Denies  Allergies: Denies  Anticoagulants/Aspirin : Denies  Heart valves: Denies  Joint replacements:  Yes - L knee   . Patient is established patient   Today patient reports: Bx proven severely dysplastic nevus of the R ant thigh, pt here today for excision.  Sister with recently dx vulvar melanoma, brother with recently dx mesothelioma.   Review of Systems:    History of melanoma ROS - denies any fevers, chills, weight loss, night sweats, lymphadenopathy, nausea, vomiting, diarrhea, chest pain, shortness of breath, headaches, changes in vision  .  The following portions of the chart were reviewed this encounter and updated as appropriate: medications, allergies, medical history  Relevant Medical History:  Personal history of melanoma - see medical history for full details   Objective  Well appearing patient in no apparent distress; mood and affect are within normal limits. Examination was performed of the: Focused Exam of: the R ant thigh   Examination notable for: pink bx site Examination limited by: n/a   R ant thigh Biopsy site.  Assessment & Plan   Follow up of bx proven severely dysplastic nevus  - excision as per below  Personal history of melanoma Sister with recently diagnosed vulvar melanoma  Brother with recently diagnosed mesothelioma  - Discussed potential genetic association, including BAP1 tumor predisposition syndrome which predisposes to uveal/cutaneous melanomas, mesotheliomas, RCC.  - Discussed genetics referral, will discuss with family members    Level of service outlined above   Procedures, orders, diagnosis for this visit:  DYSPLASTIC NEVUS R ant thigh Skin excision - R ant thigh  Excision method:  elliptical Lesion length (cm):  0.5 Lesion width (cm):   0.5 Margin per side (cm):  0.3 Total excision diameter (cm):  1.1 Informed consent: discussed and consent obtained   Timeout: patient name, date of birth, surgical site, and procedure verified   Procedure prep:  Patient was prepped and draped in usual sterile fashion Prep type:  Chlorhexidine  Anesthesia: the lesion was anesthetized in a standard fashion   Anesthetic:  1% lidocaine  w/ epinephrine 1-100,000 buffered w/ 8.4% NaHCO3 Instrument used: #15 blade   Hemostasis achieved with: suture, pressure and electrodesiccation   Outcome: patient tolerated procedure well with no complications    Skin repair - R ant thigh Complexity:  Intermediate Final length (cm):  3.1 Informed consent: discussed and consent obtained   Timeout: patient name, date of birth, surgical site, and procedure verified   Procedure prep:  Patient was prepped and draped in usual sterile fashion Prep type:  Chlorhexidine  Anesthesia: the lesion was anesthetized in a standard fashion   Anesthetic:  1% lidocaine  w/ epinephrine 1-100,000 buffered w/ 8.4% NaHCO3 Reason for type of repair: reduce tension to allow closure, reduce the risk of dehiscence, infection, and necrosis, reduce subcutaneous dead space and avoid a hematoma, allow closure of the large defect and preserve normal anatomy   Undermining: edges could be approximated without difficulty   Subcutaneous layers (deep stitches):  Suture size:  3-0 Suture type: PDS (polydioxanone)   Stitches:  Buried vertical mattress Fine/surface layer approximation (top stitches):  Suture size:  4-0 Suture type: Prolene (polypropylene)   Stitches: simple running   Hemostasis achieved with: suture, pressure and electrodesiccation Outcome: patient tolerated procedure well with no complications   Post-procedure details: sterile dressing applied and  wound care instructions given   Dressing type: petrolatum, bandage and pressure dressing    Specimen 1 - Surgical  pathology Differential Diagnosis: Biopsy proven severely dysplastic nevus Accession #: [id number] Check Margins: Yes Tagged Superior  Dysplastic nevus -     Skin excision -     Skin repair -     Surgical pathology; Standing    Return to clinic: Return for suture removal in 7-10 days.  LILLETTE Rosina Mayans, CMA, am acting as scribe for Lauraine JAYSON Kanaris, MD .   Documentation: I have reviewed the above documentation for accuracy and completeness, and I agree with the above.  Lauraine JAYSON Kanaris, MD

## 2023-11-15 NOTE — Patient Instructions (Signed)

## 2023-11-17 ENCOUNTER — Ambulatory Visit

## 2023-11-18 LAB — SURGICAL PATHOLOGY

## 2023-11-21 ENCOUNTER — Ambulatory Visit: Payer: Self-pay

## 2023-11-21 NOTE — Progress Notes (Signed)
Patient informed and voiced good understanding.

## 2023-11-22 ENCOUNTER — Ambulatory Visit

## 2023-11-22 DIAGNOSIS — F411 Generalized anxiety disorder: Secondary | ICD-10-CM | POA: Diagnosis not present

## 2023-11-22 DIAGNOSIS — D239 Other benign neoplasm of skin, unspecified: Secondary | ICD-10-CM

## 2023-11-22 DIAGNOSIS — Z85820 Personal history of malignant melanoma of skin: Secondary | ICD-10-CM

## 2023-11-22 DIAGNOSIS — F3132 Bipolar disorder, current episode depressed, moderate: Secondary | ICD-10-CM | POA: Diagnosis not present

## 2023-11-22 NOTE — Progress Notes (Signed)
   Follow-Up Visit   Subjective  Krystal Weaver is a 68 y.o. female who presents for the following: Suture removal at right thigh  Pathology showed no residual dysplastic nevus, margins free  The following portions of the chart were reviewed this encounter and updated as appropriate: medications, allergies, medical history  Review of Systems:  No other skin or systemic complaints except as noted in HPI or Assessment and Plan.  Objective  Well appearing patient in no apparent distress; mood and affect are within normal limits.  Areas Examined: Right thigh  Relevant physical exam findings are noted in the Assessment and Plan.    Assessment & Plan    Encounter for Removal of Sutures - Incision site is clean, dry and intact. - Wound cleansed, sutures removed, wound cleansed and bandage applied.  - Discussed pathology results showing no residual dysplastic nevus, margins free - Scars remodel for a full year. - Can apply over-the-counter silicone scar cream once to twice a day to help with scar remodeling if desired. - Patient advised to call with any concerns or if they notice any new or changing lesions.  Personal history of melanoma Sister with recently diagnosed vulvar melanoma  Brother with recently diagnosed mesothelioma  - Discussed potential genetic association, including BAP1 tumor predisposition syndrome which predisposes to uveal/cutaneous melanomas, mesotheliomas, RCC.  - Discussed genetics referral - referral placed   Return for TBSE As Scheduled, With Dr. Hester.  I, Jill Parcell, CMA, am acting as scribe for Lauraine JAYSON Kanaris, MD.   Documentation: I have reviewed the above documentation for accuracy and completeness, and I agree with the above.  Lauraine JAYSON Kanaris, MD

## 2023-11-22 NOTE — Patient Instructions (Signed)

## 2023-11-29 ENCOUNTER — Ambulatory Visit: Attending: Obstetrics and Gynecology

## 2023-11-29 ENCOUNTER — Other Ambulatory Visit: Payer: Self-pay

## 2023-11-29 DIAGNOSIS — R2689 Other abnormalities of gait and mobility: Secondary | ICD-10-CM | POA: Diagnosis not present

## 2023-11-29 DIAGNOSIS — M6281 Muscle weakness (generalized): Secondary | ICD-10-CM | POA: Diagnosis not present

## 2023-11-29 DIAGNOSIS — N3946 Mixed incontinence: Secondary | ICD-10-CM | POA: Insufficient documentation

## 2023-11-29 NOTE — Therapy (Signed)
 OUTPATIENT PHYSICAL THERAPY FEMALE PELVIC TREATMENT    Patient Name: Krystal Weaver MRN: [id number] DOB:11-03-1955, 68 y.o., female Today's Date: 11/29/2023  END OF SESSION:  PT End of Session - 11/29/23 1319     Visit Number 6    Number of Visits 9    Date for Recertification  11/28/23    Authorization Type [employer]    Progress Note Due on Visit 10    PT Start Time 1315    PT Stop Time 1355    PT Time Calculation (min) 40 min    Activity Tolerance Patient tolerated treatment well    Behavior During Therapy [facility] for tasks assessed/performed          Past Medical History:  Diagnosis Date   Bipolar 2 disorder (HCC)    Cancer (HCC) 11/2016   melanoma- rt lower leg   Degenerative arthritis of left knee 01/2023   Depression    Bi polar type 2   Dysplastic nevus 06/28/2018   R medial antecubital/mod, L prox antecubital/mod   Dysplastic nevus 06/20/2019   Left proximal post. lat. thigh. Moderate to severe atypia, inflamed; close to margin. Excised: 07/31/2019, margins free.   Dysplastic nevus 10/19/2023   right mid anterior thigh - severe - margin free but close to margin   Melanoma (HCC) 11/29/2017   right calf/in situ/WLE   Mixed hyperlipidemia    OSA on CPAP    Osteopenia after menopause    Paroxysmal SVT (supraventricular tachycardia)    Tubular adenoma of colon    Varicella zoster 08/2018   Varicose veins of both lower extremities with pain    Venous insufficiency of both lower extremities    Past Surgical History:  Procedure Laterality Date   BREAST CYST ASPIRATION Right 2004   Negative   BREAST EXCISIONAL BIOPSY Left 1982   Negative - Excisional   COLONOSCOPY  11/22/2005   COLONOSCOPY  03/03/2016   COLONOSCOPY  03/26/2021   DILATION AND CURETTAGE OF UTERUS  1980   KNEE ARTHROPLASTY Left 02/14/2023   Procedure: COMPUTER ASSISTED TOTAL KNEE ARTHROPLASTY;  Surgeon: Mardee Lynwood SQUIBB, MD;  Location: [facility] ORS;  Service: Orthopedics;  Laterality: Left;    MELANOMA EXCISION Right 11/2017   calf (in office)   Patient Active Problem List   Diagnosis Date Noted   History of total knee arthroplasty, left 02/14/2023   Preoperative evaluation to rule out surgical contraindication 10/13/2022   OSA on CPAP 10/12/2022   Osteopenia after menopause 01/14/2022   Abnormal screening mammogram 01/14/2022   Primary osteoarthritis of left knee 12/08/2020   Melanoma of skin (HCC) 11/21/2019   Paroxysmal SVT (supraventricular tachycardia) 10/14/2018   Tubular adenoma of colon 08/25/2017   Family history of alcoholism 08/18/2016   Varicose veins of both lower extremities with pain 11/03/2015   Chronic venous insufficiency 11/03/2015   Hyperlipidemia 02/20/2015   Bipolar disorder, unspecified (HCC) 08/24/2012   Encounter for preventive health examination 08/24/2012    PCP: Dr. Verneita Kettering  REFERRING PROVIDER: Dr. Heather Penton   REFERRING DIAG: Cystocele and mixed urinary incontinence  THERAPY DIAG:  Urinary incontinence, mixed  Other abnormalities of gait and mobility  Muscle weakness (generalized)  Rationale for Evaluation and Treatment: Rehabilitation  ONSET DATE: 08/24/23 referral date but began approx. Mid-July when she felt the prolapse (maybe grade 1)   SUBJECTIVE:  SUBJECTIVE STATEMENT: Pt reported she's still stressed but started pickleball again for mental health. She is curious if today could be the last visit as she feels like she has the tools to continue improving. It's a very stressful time for her right now and then she'll be traveling.  Pt feels approx. 60% improvement since starting PT, less pressure, no pain and no leakage.   EVAL: URINARY FUNCTION: pt voids approx. Every 3-4 hours. Pt feels like she fully empties bladder. She felt her  bladder in vaginal canal in July but not since (maybe grade 1 per GYN). Pt sometimes gets up at night to void (0-1/night). Pt reported she experienced urgency getting to the bathroom and had some dribble but not gushing. No longer wearing pads after she began performing PFM exercises (cat/cow, bridging, yoga). She's cut back on lifting until she sees PFPT. Pt denied pain with voiding. BOWEL FUNCTION: every day, sometimes twice a day. Types 3-4, 75% of the time. Pt denied pain, fully empties rectum and no hemorrhoids.  CORE STABILITY: pt denied MVAs, falls, or illness and surgeries. No chronic back pain. L TKA was first surgery. SEXUAL FUNCTION: pt denied pain with intercourse, OBGYN, or tampon insertion. Climax with clitoral stimulation.   Fluid intake: pt starts with coffee (decaf) and a smoothie in the morning, pt drinks approx. 200 oz. Of water  day, occasional wine  FUNCTIONAL LIMITATIONS: none  PERTINENT HISTORY:  Medications for current condition: none Surgeries: L TKA Other: Arthritis, depression, anxiety, L TKA, HLD, melanoma, bipolar disorder, Varicose veins BLE, sees a chiropractor for neck pain Sexual abuse: No  DIAGNOSTIC FINDINGS:   PAIN:  Are you having pain? No 11/29/23 NPRS scale: 0/10  No hip soreness   PRECAUTIONS: None  RED FLAGS: None   WEIGHT BEARING RESTRICTIONS: No  FALLS:  Has patient fallen in last 6 months? No  OCCUPATION: retired engineer, civil (consulting), volunteers at [employer] (healing touch), book club, gardens, playing sports (pickleball)  ACTIVITY LEVEL : pickleball, yoga, hiking, disc golf  PLOF: Independent  PATIENT GOALS: To not have a prolapse and maintain not having the urgency   BOWEL MOVEMENT: Pain with bowel movement: No Type of bowel movement:Frequency 1-2/day Fully empty rectum: Yes:   Leakage: No                                                     Caused by:  Pads: No Fiber supplement/laxative No  URINATION: Pain with urination: No Fully  empty bladder: Yes:                                 Post-void dribble: No Stream: Strong Urgency: Yes  Frequency:during the day every 3-4 hours                                                         Nocturia: Yes: 0-1/night   Leakage: Urge to void and Walking to the bathroom Pads/briefs: No but was wearing pantylliners twice a day when dribbling.   INTERCOURSE:  Ability to have vaginal penetration Yes  Pain with intercourse: none Dryness: No Climax: yes  Marinoff Scale: 0/3 Lubricant: sometimes  PREGNANCY: Number of pregnancies: 3 Vaginal deliveries 3 Tearing Yes: tearing with the last one Episiotomy Yes with first two C-section deliveries 0 Currently pregnant No  PROLAPSE: Pressure   OBJECTIVE:  Note: Objective measures were completed at Evaluation unless otherwise noted.   COGNITION: Overall cognitive status: Within functional limits for tasks assessed     SENSATION: Light touch: Appears intact   FUNCTIONAL TESTS:  Single leg stance:  Rt: incr. Postural sway  Lt: incr. Postural sway Squat:  GAIT: Assistive device utilized: None Comments: decr. Trunk rot  POSTURE: decreased lumbar lordosis, increased thoracic kyphosis, posterior pelvic tilt, and left pelvic obliquity   LUMBARAROM/PROM:  A/PROM A/PROM  Eval (% available)  Flexion   Extension   Right lateral flexion   Left lateral flexion   Right rotation   Left rotation    (Blank rows = not tested)  LOWER EXTREMITY ROM: WNL  Active ROM Right eval Left eval  Hip flexion 4+ 4+  Hip extension    Hip abduction 3+ 3+  Hip adduction 4- 3+  Hip internal rotation 4 4  Hip external rotation 4- 4  Knee flexion 4+ 4+  Knee extension 5 5  Ankle dorsiflexion 5 5  Ankle plantarflexion    Ankle inversion    Ankle eversion     (Blank rows = not tested)  11/29/23: Active ROM Right eval Left eval  Hip flexion 5 5  Hip extension    Hip abduction 3+ 4  Hip adduction 4+ 4+  Hip internal rotation  5 5  Hip external rotation 5 5  Knee flexion 5 5  Knee extension 5 5  Ankle dorsiflexion 5 5  Ankle plantarflexion    Ankle inversion    Ankle eversion      LOWER EXTREMITY MMT: WFL except for B hip ER slightly limited  MMT Right eval Left eval  Hip flexion    Hip extension    Hip abduction    Hip adduction    Hip internal rotation    Hip external rotation    Knee flexion    Knee extension    Ankle dorsiflexion    Ankle plantarflexion    Ankle inversion    Ankle eversion     (Blank rows = not tested) PALPATION:  General: no TTP in standing while palpating spine and SIJ. 9/25: no DR or TTP. Difficulty performing PFM contraction without glutes and core.    PELVIC MMT:   MMT eval  Vaginal   Internal Anal Sphincter   External Anal Sphincter   Puborectalis   Diastasis Recti   (Blank rows = not tested)        TONE: WFL   PROLAPSE: Hx of cystocele   TODAY'S TREATMENT:  DATE: 11/29/23    Therex:  Exercises Access Code: [email or web address] URL: [email or web address] Date: 11/29/2023 Prepared by: Delon Pinal  Exercises - Supine Pelvic Floor Contraction  - 1-2 x daily - 7 x weekly - 1 sets - 10 reps with knees in/out and 10 sec. Hold can perform in standing. - Dead Bug  - 1 x daily - 3 x weekly - 3 sets - 10 reps with knees ext.  - Lateral Lunge  - 1 x daily - 3 x weekly - 2-3 sets - 10 reps (can add weight) -standing hip ext with hamstrings firing prior to B glutes, PT palpating and glutes firing first. -clams and reverse clams in sidelying (BLE), no pain reported.  Cues and demo for proper technique. S for safety. No pain.      SELF CARE: PATIENT EDUCATION:  Education details: PT educated pt on how to progress HEP above and goal progress and d/c. Pt agreeable. Person educated: Patient Education method: Explanation,  Demonstration, and Handouts Education comprehension: verbalized understanding, returned demonstration, and needs further education  HOME EXERCISE PROGRAM: [email or web address].  ASSESSMENT:  CLINICAL IMPRESSION: D/c, see d/c summary.   OBJECTIVE IMPAIRMENTS: Abnormal gait, decreased balance, decreased coordination, decreased ROM, decreased strength, hypomobility, increased fascial restrictions, postural dysfunction, and pain.   ACTIVITY LIMITATIONS: carrying, lifting, bending, standing, squatting, transfers, continence, and locomotion level  PARTICIPATION LIMITATIONS: meal prep, cleaning, laundry, and community activity  PERSONAL FACTORS: 3+ comorbidities: see above are also affecting patient's functional outcome.   REHAB POTENTIAL: Good  CLINICAL DECISION MAKING: Stable/uncomplicated  EVALUATION COMPLEXITY: Low   GOALS: Goals reviewed with patient? Yes  SHORT TERM GOALS: Target date: for all STGs: 10/27/23  Pt will be IND in HEP to improve pain, strength, coordination. Baseline: works out but scaled back 2/2 cystocele and urgency Goal status: MET  2.  Pt will demo proper toileting posture to fully empty bladder and reduce straining during bowel movement. Baseline: unable to demo Goal status: MET  3.  Pt will demonstrated improved relaxation and contraction of PFM with coordination of breath to reduce urinary leakage to </=once/week. Baseline: with urgency but has been improving. 10/16: maybe twice since last appt.  Goal status: MET  4.   Pt will demonstrate proper lifting technique and biomechanics when strength training to decr. Pressure from cystocele. Baseline: unable to demo; 10/16: able to perform  Goal status: MET   LONG TERM GOALS: Target date: for all LTGs: 11/24/23  Pt will report resuming all activities: hiking, pickleball, yoga and going to the gym for classes to maximize gains made in PT. Baseline: reduced exercise 2/2 cystocele 11/18: has not gotten worse Goal  status: MET  2. Pt will demonstrated improved relaxation and contraction of PFM with coordination of breath to reduce urinary leakage to zero times in last three week. Baseline: twice in last three weeks 11/18: zero leaks in last three weeks Goal status: MET    PLAN: d/c   PT FREQUENCY: 1x/week  PT DURATION: 8 weeks  PLANNED INTERVENTIONS: 97164- PT Re-evaluation, 97110-Therapeutic exercises, 97530- Therapeutic activity, 97112- Neuromuscular re-education, 97535- Self Care, 02859- Manual therapy, [phone]- Gait training, [phone] (1-2 muscles), 20561 (3+ muscles)- Dry Needling, Patient/Family education, Balance training, Taping, Joint mobilization, Spinal mobilization, Scar mobilization, Cryotherapy, Moist heat, and Biofeedback    PHYSICAL THERAPY DISCHARGE SUMMARY  Visits from Start of Care: 6  Current functional level related to goals / functional outcomes: See above   Remaining deficits: Pt feels approx. 60% on GROC scale improvement since starting  PT, less pressure, no pain and no leakage.   Education / Equipment: HEP   Patient agrees to discharge. Patient goals were met. Patient is being discharged due to being pleased with the current functional level. And meeting goals.   Gilverto Dileonardo L, PT 11/29/2023, 1:20 PM  Delon Pinal, PT,DPT 11/29/23 1:20 PM Phone: [phone] Fax: [phone]

## 2023-12-01 NOTE — Progress Notes (Signed)
 Krystal Weaver                                          MRN: [id number]   12/01/2023   The [facility] Quality Team Specialist reviewed this patient medical record for the purposes of chart review for care gap closure. The following were reviewed: chart review for care gap closure-glycemic status assessment and kidney health evaluation for diabetes:eGFR  and uACR.    [facility] Quality Team

## 2023-12-05 MED ORDER — FENTANYL CITRATE (PF) 100 MCG/2ML IJ SOLN
INTRAMUSCULAR | Status: AC
  Filled 2023-12-05: qty 2

## 2023-12-06 DIAGNOSIS — F3132 Bipolar disorder, current episode depressed, moderate: Secondary | ICD-10-CM | POA: Diagnosis not present

## 2023-12-06 DIAGNOSIS — F411 Generalized anxiety disorder: Secondary | ICD-10-CM | POA: Diagnosis not present

## 2023-12-15 ENCOUNTER — Ambulatory Visit

## 2023-12-19 ENCOUNTER — Ambulatory Visit: Admitting: Internal Medicine

## 2023-12-19 ENCOUNTER — Encounter: Payer: Self-pay | Admitting: Internal Medicine

## 2023-12-19 VITALS — BP 130/80 | HR 71 | Ht 65.0 in | Wt 131.8 lb

## 2023-12-19 DIAGNOSIS — F3181 Bipolar II disorder: Secondary | ICD-10-CM

## 2023-12-19 DIAGNOSIS — F411 Generalized anxiety disorder: Secondary | ICD-10-CM | POA: Diagnosis not present

## 2023-12-19 DIAGNOSIS — E782 Mixed hyperlipidemia: Secondary | ICD-10-CM

## 2023-12-19 DIAGNOSIS — Z85820 Personal history of malignant melanoma of skin: Secondary | ICD-10-CM

## 2023-12-19 DIAGNOSIS — R233 Spontaneous ecchymoses: Secondary | ICD-10-CM | POA: Diagnosis not present

## 2023-12-19 DIAGNOSIS — M1712 Unilateral primary osteoarthritis, left knee: Secondary | ICD-10-CM

## 2023-12-19 DIAGNOSIS — Z0001 Encounter for general adult medical examination with abnormal findings: Secondary | ICD-10-CM

## 2023-12-19 DIAGNOSIS — R7301 Impaired fasting glucose: Secondary | ICD-10-CM | POA: Diagnosis not present

## 2023-12-19 DIAGNOSIS — N950 Postmenopausal bleeding: Secondary | ICD-10-CM

## 2023-12-19 DIAGNOSIS — D126 Benign neoplasm of colon, unspecified: Secondary | ICD-10-CM

## 2023-12-19 DIAGNOSIS — Z0000 Encounter for general adult medical examination without abnormal findings: Secondary | ICD-10-CM

## 2023-12-19 DIAGNOSIS — Z1231 Encounter for screening mammogram for malignant neoplasm of breast: Secondary | ICD-10-CM

## 2023-12-19 DIAGNOSIS — F3132 Bipolar disorder, current episode depressed, moderate: Secondary | ICD-10-CM | POA: Diagnosis not present

## 2023-12-19 DIAGNOSIS — Z79899 Other long term (current) drug therapy: Secondary | ICD-10-CM

## 2023-12-19 LAB — COMPREHENSIVE METABOLIC PANEL WITH GFR
ALT: 16 U/L (ref 0–35)
AST: 23 U/L (ref 0–37)
Albumin: 4.9 g/dL (ref 3.5–5.2)
Alkaline Phosphatase: 56 U/L (ref 39–117)
BUN: 12 mg/dL (ref 6–23)
CO2: 30 meq/L (ref 19–32)
Calcium: 9.3 mg/dL (ref 8.4–10.5)
Chloride: 96 meq/L (ref 96–112)
Creatinine, Ser: 0.88 mg/dL (ref 0.40–1.20)
GFR: 67.22 mL/min (ref >60.00)
Glucose, Bld: 95 mg/dL (ref 70–99)
Potassium: 4 meq/L (ref 3.5–5.1)
Sodium: 135 meq/L (ref 135–145)
Total Bilirubin: 0.6 mg/dL (ref 0.2–1.2)
Total Protein: 6.7 g/dL (ref 6.0–8.3)

## 2023-12-19 LAB — LIPID PANEL
Cholesterol: 239 mg/dL — ABNORMAL HIGH (ref 0–200)
HDL: 80.3 mg/dL (ref >39.00)
LDL Cholesterol: 145 mg/dL — ABNORMAL HIGH (ref 0–99)
NonHDL: 158.8
Total CHOL/HDL Ratio: 3
Triglycerides: 71 mg/dL (ref 0.0–149.0)
VLDL: 14.2 mg/dL (ref 0.0–40.0)

## 2023-12-19 LAB — CBC WITH DIFFERENTIAL/PLATELET
Basophils Absolute: 0 K/uL (ref 0.0–0.1)
Basophils Relative: 0.7 % (ref 0.0–3.0)
Eosinophils Absolute: 0 K/uL (ref 0.0–0.7)
Eosinophils Relative: 0.2 % (ref 0.0–5.0)
HCT: 41.1 % (ref 36.0–46.0)
Hemoglobin: 13.8 g/dL (ref 12.0–15.0)
Lymphocytes Relative: 20.7 % (ref 12.0–46.0)
Lymphs Abs: 1.3 K/uL (ref 0.7–4.0)
MCHC: 33.7 g/dL (ref 30.0–36.0)
MCV: 91.3 fl (ref 78.0–100.0)
Monocytes Absolute: 0.4 K/uL (ref 0.1–1.0)
Monocytes Relative: 6.3 % (ref 3.0–12.0)
Neutro Abs: 4.7 K/uL (ref 1.4–7.7)
Neutrophils Relative %: 72.1 % (ref 43.0–77.0)
Platelets: 258 K/uL (ref 150.0–400.0)
RBC: 4.5 Mil/uL (ref 3.87–5.11)
RDW: 12.6 % (ref 11.5–15.5)
WBC: 6.5 K/uL (ref 4.0–10.5)

## 2023-12-19 LAB — TSH: TSH: 0.81 u[IU]/mL (ref 0.35–5.50)

## 2023-12-19 LAB — HEMOGLOBIN A1C: Hgb A1c MFr Bld: 5.3 % (ref 4.6–6.5)

## 2023-12-19 LAB — LDL CHOLESTEROL, DIRECT: Direct LDL: 140 mg/dL
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
## 2023-12-19 NOTE — Patient Instructions (Signed)
 YOUR MAMMOGRAM Ihas been ordered , PLEASE CALL AND GET THIS SCHEDULED [facility] - call [phone]  Veronda does  the scheduling for [facility] as well      Viviane Drone , MD is the psychiatrist I mentioned. But whoever you want to see is FINE WITH ME

## 2023-12-20 ENCOUNTER — Ambulatory Visit: Payer: Self-pay | Admitting: Internal Medicine

## 2023-12-20 ENCOUNTER — Encounter: Payer: Self-pay | Admitting: Internal Medicine

## 2023-12-20 DIAGNOSIS — N950 Postmenopausal bleeding: Secondary | ICD-10-CM | POA: Insufficient documentation

## 2023-12-20 NOTE — Assessment & Plan Note (Signed)
 S/p TKR by Signe Hue Feb 2025 .SABRA  Excellent recovery

## 2023-12-20 NOTE — Assessment & Plan Note (Signed)
 Based on current lipid profile, the risk of clinically significant Coronary artery disease is 3<5% over the next 10 years, using the AHA risk calculator.  No medication needed   Lab Results  Component Value Date   CHOL 239 (H) 12/19/2023   HDL 80.30 12/19/2023   LDLCALC 145 (H) 12/19/2023   LDLDIRECT 140.0 12/19/2023   TRIG 71.0 12/19/2023   CHOLHDL 3 12/19/2023   2

## 2023-12-20 NOTE — Assessment & Plan Note (Signed)
 Found in 2018.  Repeat exam Feb 2023 was negative (locklear) and he advised no further screening given her age. She is not comfortable with abandoning screening so we will use cologuard in 2033.

## 2023-12-20 NOTE — Assessment & Plan Note (Signed)

## 2023-12-20 NOTE — Assessment & Plan Note (Signed)
 Managed with [facility].  Biopsy negative for CA

## 2023-12-20 NOTE — Assessment & Plan Note (Signed)
 She continues to have follow up and is recovering from an excision of left anterior thigh which was dysplatic but not melanoma.

## 2023-12-29 NOTE — Progress Notes (Signed)
 Krystal Weaver                                          MRN: [id number]   12/29/2023   The VBCI Quality Team Specialist reviewed this patient medical record for the purposes of chart review for care gap closure. The following were reviewed: abstraction for care gap closure-glycemic status assessment.    VBCI Quality Team

## 2023-12-29 NOTE — Progress Notes (Signed)
 Krystal Weaver                                          MRN: [id number]   12/29/2023   The VBCI Quality Team Specialist reviewed this patient medical record for the purposes of chart review for care gap closure. The following were reviewed: chart review for care gap closure-kidney health evaluation for diabetes:eGFR  and uACR.    VBCI Quality Team

## 2024-01-03 ENCOUNTER — Ambulatory Visit

## 2024-01-10 ENCOUNTER — Ambulatory Visit

## 2024-01-22 ENCOUNTER — Other Ambulatory Visit: Payer: Self-pay | Admitting: Internal Medicine

## 2024-01-22 DIAGNOSIS — F3181 Bipolar II disorder: Secondary | ICD-10-CM

## 2024-01-24 ENCOUNTER — Encounter: Payer: Self-pay | Admitting: Genetic Counselor

## 2024-01-24 ENCOUNTER — Encounter: Payer: Self-pay | Admitting: Internal Medicine

## 2024-01-24 DIAGNOSIS — M1712 Unilateral primary osteoarthritis, left knee: Secondary | ICD-10-CM

## 2024-01-26 ENCOUNTER — Inpatient Hospital Stay: Attending: Genetic Counselor | Admitting: Genetic Counselor

## 2024-01-26 ENCOUNTER — Encounter: Payer: Self-pay | Admitting: Genetic Counselor

## 2024-01-26 ENCOUNTER — Inpatient Hospital Stay

## 2024-01-26 ENCOUNTER — Other Ambulatory Visit: Payer: Self-pay | Admitting: Genetic Counselor

## 2024-01-26 DIAGNOSIS — Z85820 Personal history of malignant melanoma of skin: Secondary | ICD-10-CM

## 2024-01-26 DIAGNOSIS — Z808 Family history of malignant neoplasm of other organs or systems: Secondary | ICD-10-CM | POA: Insufficient documentation

## 2024-01-26 DIAGNOSIS — Z803 Family history of malignant neoplasm of breast: Secondary | ICD-10-CM | POA: Insufficient documentation

## 2024-01-26 LAB — GENETIC SCREENING ORDER

## 2024-01-26 NOTE — Progress Notes (Signed)
 REFERRING PROVIDER: Raymund Lauraine BROCKS, MD [address] [place],  [place] [address]  PRIMARY PROVIDER:  Marylynn Verneita CROME, MD  PRIMARY REASON FOR VISIT:  1. Family history of melanoma   2. Family history of breast cancer   3. Family history of glioblastoma   4. History of melanoma excision      HISTORY OF PRESENT ILLNESS:   Ms. Kats, a 69 y.o. female, was seen for a [place] cancer genetics consultation at the request of Dr. Raymund due to a personal and family history of melanoma, and family history of cancers.  Ms. Kaney presents to clinic today to discuss the possibility of a hereditary predisposition to cancer, genetic testing, and to further clarify her future cancer risks, as well as potential cancer risks for family members.   In 2019, at the age of 72, Ms. Yordy was diagnosed with melanoma of the right calf. The treatment plan Mose surgery.  She has had several dysplastic nevi removed as well.     CANCER HISTORY:  Oncology History   No problem history exists.     RISK FACTORS:  Menarche was at age 37.  First live birth at age 46.  OCP use for approximately 2 years.  Ovaries intact: yes.  Hysterectomy: no.  Menopausal status: postmenopausal.  HRT use: 0 years. Colonoscopy: yes; normal. Mammogram within the last year: yes. Number of breast biopsies: 1. Up to date with pelvic exams: n/a. Any excessive radiation exposure in the past: no  Past Medical History:  Diagnosis Date   Bipolar 2 disorder (HCC)    Cancer (HCC) 11/2016   melanoma- rt lower leg   Degenerative arthritis of left knee 01/2023   Depression    Bi polar type 2   Dysplastic nevus 06/28/2018   R medial antecubital/mod, L prox antecubital/mod   Dysplastic nevus 06/20/2019   Left proximal post. lat. thigh. Moderate to severe atypia, inflamed; close to margin. Excised: 07/31/2019, margins free.   Dysplastic nevus 10/19/2023   right mid anterior thigh - severe - margin free but close to margin    Family history of breast cancer    Family history of glioblastoma    Family history of melanoma    Melanoma (HCC) 11/29/2017   right calf/in situ/WLE   Mixed hyperlipidemia    OSA on CPAP    Osteopenia after menopause    Paroxysmal SVT (supraventricular tachycardia)    Tubular adenoma of colon    Varicella zoster 08/2018   Varicose veins of both lower extremities with pain    Venous insufficiency of both lower extremities     Past Surgical History:  Procedure Laterality Date   BREAST CYST ASPIRATION Right 2004   Negative   BREAST EXCISIONAL BIOPSY Left 1982   Negative - Excisional   COLONOSCOPY  11/22/2005   COLONOSCOPY  03/03/2016   COLONOSCOPY  03/26/2021   DILATION AND CURETTAGE OF UTERUS  1980   KNEE ARTHROPLASTY Left 02/14/2023   Procedure: COMPUTER ASSISTED TOTAL KNEE ARTHROPLASTY;  Surgeon: Mardee Lynwood SQUIBB, MD;  Location: [facility] ORS;  Service: Orthopedics;  Laterality: Left;   MELANOMA EXCISION Right 11/2017   calf (in office)    Social History   Socioeconomic History   Marital status: Married    Spouse name: Garrel   Number of children: 3   Years of education: Not on file   Highest education level: Not on file  Occupational History   Not on file  Tobacco Use   Smoking status: Never  Smokeless tobacco: Never  Vaping Use   Vaping status: Never Used  Substance and Sexual Activity   Alcohol use: Yes    Alcohol/week: 3.0 standard drinks of alcohol    Types: 3 Glasses of wine per week   Drug use: No   Sexual activity: Yes  Other Topics Concern   Not on file  Social History Narrative   Not on file   Social Drivers of Health   Tobacco Use: Low Risk  (01/05/2024)   Received from [facility]   Patient History    Smoking Tobacco Use: Never    Smokeless Tobacco Use: Never    Passive Exposure: Not on file  Financial Resource Strain: Patient Declined (08/04/2023)   Received from [facility]   Overall Financial Resource Strain (CARDIA)     Difficulty of Paying Living Expenses: Patient declined  Food Insecurity: Patient Declined (08/04/2023)   Received from [facility]   Epic    Within the past 12 months, you worried that your food would run out before you got the money to buy more.: Patient declined    Within the past 12 months, the food you bought just didn't last and you didn't have money to get more.: Patient declined  Transportation Needs: Patient Declined (08/04/2023)   Received from [facility] - Transportation    In the past 12 months, has lack of transportation kept you from medical appointments or from getting medications?: Patient declined    Lack of Transportation (Non-Medical): Patient declined  Physical Activity: Not on file  Stress: No Stress Concern Present (09/17/2021)   [employer] [facility] [employer] - Occupational Stress Questionnaire    Feeling of Stress : Not at all  Social Connections: Socially Integrated (02/15/2023)   Social Connection and Isolation Panel    Frequency of Communication with Friends and Family: More than three times a week    Frequency of Social Gatherings with Friends and Family: More than three times a week    Attends Religious Services: More than 4 times per year    Active Member of Clubs or Organizations: Yes    Attends Banker Meetings: More than 4 times per year    Marital Status: Married  Depression (PHQ2-9): High Risk (12/19/2023)   Depression (PHQ2-9)    PHQ-2 Score: 14  Alcohol Screen: Not on file  Housing: Patient Declined (08/04/2023)   Received from [facility]   Epic    At any time in the past 12 months, were you homeless or living in a shelter (including now)?: Patient declined    In the past 12 months, how many times have you moved where you were living?: 0    In the last 12 months, was there a time when you were not able to pay the mortgage or rent on time?: Patient declined  Utilities:  Patient Declined (08/04/2023)   Received from [facility]   Epic    In the past 12 months has the electric, gas, oil, or water  company threatened to shut off services in your home?: Patient declined  Health Literacy: Not on file     FAMILY HISTORY:  We obtained a detailed, 4-generation family history.  Significant diagnoses are listed below: Family History  Problem Relation Age of Onset   Alcohol abuse Mother    Heart disease Mother    Learning disabilities Mother    Mental illness Mother    Cancer Father  57       brain tumor   Alcohol abuse Sister    Cancer Sister    Breast cancer Sister 36   Melanoma Brother    Alcoholism Brother    Alcohol abuse Son    Psoriasis Son    Breast cancer Maternal Aunt 17     Significant family history includes: Brother with melanoma at 35 and 74, mesothelioma at 46 Sister with breast cancer at 12 and Vulvar melanoma at 80 Father with glioblastoma Paternal cousin with breast cancer Paternal cousin with brain cancer Paternal grandmother with stomach cancer Maternal aunt with breast cancer Maternal grandfather with bladder cancer  Ms. Cotterman is unaware of previous family history of genetic testing for hereditary cancer risks.  There is no reported Ashkenazi Jewish ancestry. There is no known consanguinity.  GENETIC COUNSELING ASSESSMENT: Ms. Dasher is a 69 y.o. female with a personal and family history of melanoma and family history of breast cancer which is somewhat suggestive of a hereditary cancer syndrome and predisposition to cancer given the combination of melanoma cases and brain cancer, as well as young ages of onset of breast cancer. We, therefore, discussed and recommended the following at today's visit.   DISCUSSION: We discussed that, in general, most cancer is not inherited in families, but instead is sporadic or familial. Sporadic cancers occur by chance and typically happen at older ages (>50 years) as this type of  cancer is caused by genetic changes acquired during an individuals lifetime. Some families have more cancers than would be expected by chance; however, the ages or types of cancer are not consistent with a known genetic mutation or known genetic mutations have been ruled out. This type of familial cancer is thought to be due to a combination of multiple genetic, environmental, hormonal, and lifestyle factors. While this combination of factors likely increases the risk of cancer, the exact source of this risk is not currently identifiable or testable.  We discussed that 5 - 10% of cancer is hereditary, with most cases of melanoma associated with CDKN2A.  There are other genes that can be associated with hereditary melanoma cancer syndromes.  These include BAP1, POT1, MITF and a few others.  The patient reports a family history of breast cancer, and a young breast cancer in her sister.  Some breast cancer genes have been associated with an increased risk for melanoma, although melanoma is not a primary cancer in these syndromes.  We discussed that testing is beneficial for several reasons including knowing how to follow individuals after completing their treatment, identifying whether potential treatment options such as PARP inhibitors would be beneficial, and understand if other family members could be at risk for cancer and allow them to undergo genetic testing.   We reviewed the characteristics, features and inheritance patterns of hereditary cancer syndromes. We also discussed genetic testing, including the appropriate family members to test, the process of testing, insurance coverage and turn-around-time for results. We discussed the implications of a negative, positive, carrier and/or variant of uncertain significant result. Ms. Weathersby  was offered a common hereditary cancer panel (36+ genes) and an expanded pan-cancer panel (70+ genes). Ms. Youngers was informed of the benefits and limitations of each panel,  including that expanded pan-cancer panels contain genes that do not have clear management guidelines at this point in time.  We also discussed that as the number of genes included on a panel increases, the chances of variants of uncertain significance increases. Ms. Haire decided to  pursue genetic testing for the CancerNext-Expanded+RNAinsight gene panel.   The CancerNext-Expanded gene panel offered by [facility] and includes sequencing, rearrangement, and RNA analysis for the following 77 genes: AIP, ALK, APC, ATM, BAP1, BARD1, BMPR1A, BRCA1, BRCA2, BRIP1, CDC73, CDH1, CDK4, CDKN1B, CDKN2A, CEBPA, CHEK2, CTNNA1, DDX41, DICER1, ETV6, FH, FLCN, GATA2, LZTR1, MAX, MBD4, MEN1, MET, MLH1, MSH2, MSH3, MSH6, MUTYH, NF1, NF2, NTHL1, PALB2, PHOX2B, PMS2, POT1, PRKAR1A, PTCH1, PTEN, RAD51C, RAD51D, RB1, RET, RPS20, RUNX1, SDHA, SDHAF2, SDHB, SDHC, SDHD, SMAD4, SMARCA4, SMARCB1, SMARCE1, STK11, SUFU, TMEM127, TP53, TSC1, TSC2, VHL, and WT1 (sequencing and deletion/duplication); AXIN2, CTNNA1, DDX41, EGFR, HOXB13, KIT, MBD4, MITF, MSH3, PDGFRA, POLD1 and POLE (sequencing only); EPCAM and GREM1 (deletion/duplication only). RNA data is routinely analyzed for use in variant interpretation for all genes.   Based on Ms. Diefenderfer's personal and family history of cancer, she meets medical criteria for genetic testing.  Despite that she meets criteria, she may still have an out of pocket cost. We discussed that if her out of pocket cost for testing is over $100, the laboratory will call and confirm whether she wants to proceed with testing.  If the out of pocket cost of testing is less than $100 she will be billed by the genetic testing laboratory.   We discussed that some people do not want to undergo genetic testing due to fear of genetic discrimination.  The Genetic Information Nondiscrimination Act (GINA) was signed into federal law in 2008. GINA prohibits health insurers and most employers from discriminating against  individuals based on genetic information (including the results of genetic tests and family history information). According to GINA, health insurance companies cannot consider genetic information to be a preexisting condition, nor can they use it to make decisions regarding coverage or rates. GINA also makes it illegal for most employers to use genetic information in making decisions about hiring, firing, promotion, or terms of employment. It is important to note that GINA does not offer protections for life insurance, disability insurance, or long-term care insurance. GINA does not apply to those in the [employer], those who work for companies with less than 15 employees, and new life insurance or long-term disability insurance policies.  Health status due to a cancer diagnosis is not protected under GINA. More information about GINA can be found by visiting eliteclients.be.  PLAN: After considering the risks, benefits, and limitations, Ms. Gerst provided informed consent to pursue genetic testing and the blood sample was sent to [facility] for analysis of the CancerNext-Expanded+RNAinsight. Results should be available within approximately 2-3 weeks' time, at which point they will be disclosed by telephone to Ms. Merkle, as will any additional recommendations warranted by these results. Ms. Halseth will receive a summary of her genetic counseling visit and a copy of her results once available. This information will also be available in Epic.   Lastly, we encouraged Ms. Rockford to remain in contact with cancer genetics annually so that we can continuously update the family history and inform her of any changes in cancer genetics and testing that may be of benefit for this family.   Ms. Kissner questions were answered to her satisfaction today. Our contact information was provided should additional questions or concerns arise. Thank you for the referral and allowing us  to share in the care of  your patient.   Issa Luster P. Perri, MS, CGC Licensed, Patent Attorney [email or web address] phone: [phone]  I personally spent a total of 55 minutes in the care of the patient today  including preparing to see the patient, getting/reviewing separately obtained history, counseling and educating, placing orders, and documenting clinical information in the EHR. The patient was seen alone.  Drs. Lanny Stalls, and/or Gudena were available for questions, if needed..    _______________________________________________________________________ For Office Staff:  Number of people involved in session: 1 Was an Intern/ student involved with case: no

## 2024-01-26 NOTE — Progress Notes (Signed)
 Krystal Weaver                                          MRN: [id number]   01/26/2024   The VBCI Quality Team Specialist reviewed this patient medical record for the purposes of chart review for care gap closure. The following were reviewed: chart review for care gap closure-diabetic eye exam.    VBCI Quality Team

## 2024-02-03 ENCOUNTER — Telehealth: Payer: Self-pay | Admitting: Genetic Counselor

## 2024-02-03 ENCOUNTER — Encounter: Payer: Self-pay | Admitting: Genetic Counselor

## 2024-02-03 DIAGNOSIS — Z1379 Encounter for other screening for genetic and chromosomal anomalies: Secondary | ICD-10-CM | POA: Insufficient documentation

## 2024-02-03 NOTE — Telephone Encounter (Signed)
 LM on VM that results are back and to please call.  Left CB instructions.

## 2024-02-08 ENCOUNTER — Ambulatory Visit: Payer: Self-pay | Admitting: Genetic Counselor

## 2024-02-08 DIAGNOSIS — Z803 Family history of malignant neoplasm of breast: Secondary | ICD-10-CM

## 2024-02-08 DIAGNOSIS — Z1379 Encounter for other screening for genetic and chromosomal anomalies: Secondary | ICD-10-CM

## 2024-02-08 DIAGNOSIS — Z808 Family history of malignant neoplasm of other organs or systems: Secondary | ICD-10-CM

## 2024-02-08 NOTE — Telephone Encounter (Signed)
 I contacted  Ms. Krystal Weaver to discuss her genetic testing results. No pathogenic variants were identified in the 77 genes analyzed. Discussed that we do not know why she has cancer or why there is cancer in the family. It could be due to a different gene that we are not testing, or maybe our current technology may not be able to pick something up.  It will be important for her to keep in contact with genetics to keep up with whether additional testing may be needed. Detailed clinic note to follow.   The test report will be scanned into EPIC and will be located under the Molecular Pathology section of the Results Review tab.  A portion of the result report is included below for reference.

## 2024-02-08 NOTE — Progress Notes (Signed)
 HPI:  Krystal Weaver was previously seen in the [facility] due to a personal and family history of cancer and concerns regarding a hereditary predisposition to cancer. Please refer to our prior cancer genetics clinic note for more information regarding our discussion, assessment and recommendations, at the time. Krystal Weaver recent genetic test results were disclosed to her, as were recommendations warranted by these results. These results and recommendations are discussed in more detail below.  CANCER HISTORY:  Oncology History   No problem history exists.    FAMILY HISTORY:  We obtained a detailed, 4-generation family history.  Significant diagnoses are listed below: Family History  Problem Relation Age of Onset   Alcohol abuse Mother    Heart disease Mother    Learning disabilities Mother    Mental illness Mother    Cancer Father 23       brain tumor   Alcohol abuse Sister    Cancer Sister    Breast cancer Sister 59   Melanoma Brother    Alcoholism Brother    Alcohol abuse Son    Psoriasis Son    Breast cancer Maternal Aunt 79       Significant family history includes: Brother with melanoma at 63 and 48, mesothelioma at 48 Sister with breast cancer at 93 and Vulvar melanoma at 33 Father with glioblastoma Paternal cousin with breast cancer Paternal cousin with brain cancer Paternal grandmother with stomach cancer Maternal aunt with breast cancer Maternal grandfather with bladder cancer   Krystal Weaver is unaware of previous family history of genetic testing for hereditary cancer risks.  There is no reported Ashkenazi Jewish ancestry. There is no known consanguinity  GENETIC TEST RESULTS: Genetic testing reported out on February 02, 2024 through the [facility] CancerNext-Expanded+RNAinsight cancer panel found no pathogenic mutations. The CancerNext-Expanded gene panel offered by [facility] and includes sequencing, rearrangement, and RNA analysis for the  following 77 genes: AIP, ALK, APC, ATM, BAP1, BARD1, BMPR1A, BRCA1, BRCA2, BRIP1, CDC73, CDH1, CDK4, CDKN1B, CDKN2A, CEBPA, CHEK2, CTNNA1, DDX41, DICER1, ETV6, FH, FLCN, GATA2, LZTR1, MAX, MBD4, MEN1, MET, MLH1, MSH2, MSH3, MSH6, MUTYH, NF1, NF2, NTHL1, PALB2, PHOX2B, PMS2, POT1, PRKAR1A, PTCH1, PTEN, RAD51C, RAD51D, RB1, RET, RPS20, RUNX1, SDHA, SDHAF2, SDHB, SDHC, SDHD, SMAD4, SMARCA4, SMARCB1, SMARCE1, STK11, SUFU, TMEM127, TP53, TSC1, TSC2, VHL, and WT1 (sequencing and deletion/duplication); AXIN2, CTNNA1, DDX41, EGFR, HOXB13, KIT, MBD4, MITF, MSH3, PDGFRA, POLD1 and POLE (sequencing only); EPCAM and GREM1 (deletion/duplication only). RNA data is routinely analyzed for use in variant interpretation for all genes.  The test report has been scanned into EPIC and is located under the Molecular Pathology section of the Results Review tab.  A portion of the result report is included below for reference.     We discussed with Krystal Weaver that because current genetic testing is not perfect, it is possible there may be a gene mutation in one of these genes that current testing cannot detect, but that chance is small.  We also discussed, that there could be another gene that has not yet been discovered, or that we have not yet tested, that is responsible for the cancer diagnoses in the family. It is also possible there is a hereditary cause for the cancer in the family that Krystal Weaver did not inherit and therefore was not identified in her testing.  Therefore, it is important to remain in touch with cancer genetics in the future so that we can continue to offer Krystal Weaver the most up  to date genetic testing.   ADDITIONAL GENETIC TESTING:We discussed with Krystal Weaver that her genetic testing was fairly extensive.  If there are genes identified to increase cancer risk that can be analyzed in the future, we would be happy to discuss and coordinate this testing at that time.    CANCER SCREENING RECOMMENDATIONS: [name]'s test result is considered negative (normal).  This means that we have not identified a hereditary cause for her personal and family history of cancer at this time. Most cancers happen by chance and this negative test suggests that her personal and family history of cancer may fall into this category.    Possible reasons for Krystal Weaver's negative genetic test include:  1. There may be a gene mutation in one of these genes that current testing methods cannot detect but that chance is small.  2. There could be another gene that has not yet been discovered, or that we have not yet tested, that is responsible for the cancer diagnoses in the family.  3.  There may be no hereditary risk for cancer in the family. The cancers in Krystal Weaver and/or her family may be sporadic/familial or due to other genetic and environmental factors. 4. It is also possible there is a hereditary cause for the cancer in the family that Krystal Weaver did not inherit.  Therefore, it is recommended she continue to follow the cancer management and screening guidelines provided by her oncology and primary healthcare provider. An individual's cancer risk and medical management are not determined by genetic test results alone. Overall cancer risk assessment incorporates additional factors, including personal medical history, family history, and any available genetic information that may result in a personalized plan for cancer prevention and surveillance  The Tyrer-Cuzick model is one of multiple prediction models developed to estimate an individual's lifetime risk of developing breast cancer. The Tyrer-Cuzick model is endorsed by the [employer] (NCCN). This model includes many risk factors such as family history, endogenous estrogen exposure, and benign breast disease. The calculation is highly-dependent on the accuracy of clinical data provided by the patient and can change over time. The Tyrer-Cuzick model  may be repeated to reflect new information in her personal or family history in the future.    [name]'s Tyrer-Cuzick risk score is 7.3%.    RECOMMENDATIONS FOR FAMILY MEMBERS:   Since she did not inherit a identifiable mutation in a cancer predisposition gene included on this panel, her children could not have inherited a known mutation from her in one of these genes. Individuals in this family might be at some increased risk of developing cancer, over the general population risk, simply due to the family history of cancer.  We recommended women in this family have a yearly mammogram beginning at age 40, or 94 years younger than the earliest onset of cancer, an annual clinical breast exam, and perform monthly breast self-exams. Women in this family should also have a gynecological exam as recommended by their primary provider. All family members should be referred for colonoscopy starting at age 77, or 64 years younger than the earliest onset of cancer.  FOLLOW-UP: Lastly, we discussed with Ms. Detwiler that cancer genetics is a rapidly advancing field and it is possible that new genetic tests will be appropriate for her and/or her family members in the future. We encouraged her to remain in contact with cancer genetics on an annual basis so we can update her personal and family histories and let  her know of advances in cancer genetics that may benefit this family.   Our contact number was provided. Ms. Narciso questions were answered to her satisfaction, and she knows she is welcome to call us  at anytime with additional questions or concerns.   Darice Monte, MS, [facility] Licensed, Certified Genetic Counselor [email or web address]

## 2024-02-10 ENCOUNTER — Encounter: Payer: Self-pay | Admitting: Internal Medicine

## 2024-02-10 ENCOUNTER — Ambulatory Visit
Admission: RE | Admit: 2024-02-10 | Discharge: 2024-02-10 | Disposition: A | Source: Ambulatory Visit | Attending: Internal Medicine | Admitting: Internal Medicine

## 2024-02-10 DIAGNOSIS — Z1231 Encounter for screening mammogram for malignant neoplasm of breast: Secondary | ICD-10-CM | POA: Insufficient documentation

## 2024-02-10 DIAGNOSIS — F3181 Bipolar II disorder: Secondary | ICD-10-CM

## 2024-02-14 NOTE — Telephone Encounter (Signed)
 Is it okay to place this referral ?

## 2024-02-16 ENCOUNTER — Encounter: Payer: Self-pay | Admitting: Internal Medicine

## 2024-02-16 ENCOUNTER — Other Ambulatory Visit: Payer: Self-pay | Admitting: Internal Medicine

## 2024-02-16 DIAGNOSIS — F3181 Bipolar II disorder: Secondary | ICD-10-CM

## 2024-04-02 ENCOUNTER — Ambulatory Visit: Admitting: Internal Medicine

## 2024-10-29 ENCOUNTER — Ambulatory Visit: Admitting: Dermatology
